# Patient Record
Sex: Female | Born: 1935 | Race: White | Hispanic: No | Smoking: Never smoker
Health system: Southern US, Community
[De-identification: ages and names within clinical notes are randomized; demographics above are authoritative.]

## PROBLEM LIST (undated history)

## (undated) DIAGNOSIS — I2510 Atherosclerotic heart disease of native coronary artery without angina pectoris: Secondary | ICD-10-CM

## (undated) DIAGNOSIS — F0390 Unspecified dementia without behavioral disturbance: Secondary | ICD-10-CM

## (undated) DIAGNOSIS — I10 Essential (primary) hypertension: Secondary | ICD-10-CM

## (undated) DIAGNOSIS — F329 Major depressive disorder, single episode, unspecified: Secondary | ICD-10-CM

## (undated) DIAGNOSIS — J449 Chronic obstructive pulmonary disease, unspecified: Secondary | ICD-10-CM

## (undated) DIAGNOSIS — I4891 Unspecified atrial fibrillation: Secondary | ICD-10-CM

## (undated) DIAGNOSIS — F32A Depression, unspecified: Secondary | ICD-10-CM

---

## 2004-04-10 ENCOUNTER — Encounter: Payer: Self-pay | Admitting: Family Medicine

## 2004-05-05 ENCOUNTER — Emergency Department: Payer: Self-pay | Admitting: Emergency Medicine

## 2004-05-05 ENCOUNTER — Other Ambulatory Visit: Payer: Self-pay

## 2004-05-09 ENCOUNTER — Encounter: Payer: Self-pay | Admitting: Family Medicine

## 2004-06-08 ENCOUNTER — Encounter: Payer: Self-pay | Admitting: Family Medicine

## 2005-11-29 ENCOUNTER — Ambulatory Visit: Payer: Self-pay | Admitting: Gastroenterology

## 2006-01-29 ENCOUNTER — Ambulatory Visit: Payer: Self-pay | Admitting: Cardiovascular Disease

## 2007-05-13 ENCOUNTER — Other Ambulatory Visit: Payer: Self-pay

## 2007-05-14 ENCOUNTER — Observation Stay: Payer: Self-pay | Admitting: Internal Medicine

## 2007-07-22 ENCOUNTER — Emergency Department: Payer: Self-pay | Admitting: Emergency Medicine

## 2007-07-22 ENCOUNTER — Other Ambulatory Visit: Payer: Self-pay

## 2007-11-23 ENCOUNTER — Other Ambulatory Visit: Payer: Self-pay

## 2007-11-23 ENCOUNTER — Emergency Department: Payer: Self-pay | Admitting: Emergency Medicine

## 2007-12-12 ENCOUNTER — Other Ambulatory Visit: Payer: Self-pay

## 2007-12-12 ENCOUNTER — Emergency Department: Payer: Self-pay | Admitting: Emergency Medicine

## 2008-04-07 ENCOUNTER — Emergency Department: Payer: Self-pay | Admitting: Emergency Medicine

## 2008-04-07 ENCOUNTER — Other Ambulatory Visit: Payer: Self-pay

## 2008-05-03 ENCOUNTER — Inpatient Hospital Stay: Payer: Self-pay | Admitting: Unknown Physician Specialty

## 2008-05-08 ENCOUNTER — Encounter: Payer: Self-pay | Admitting: Internal Medicine

## 2008-05-09 ENCOUNTER — Encounter: Payer: Self-pay | Admitting: Internal Medicine

## 2008-08-07 ENCOUNTER — Inpatient Hospital Stay: Payer: Self-pay | Admitting: Internal Medicine

## 2008-11-04 IMAGING — CR PELVIS - 1-2 VIEW
1 series · 1 of 1 positions shown · non-contrast
Comparison: No comparison

REASON FOR EXAM: injury from fall
COMMENTS:

PROCEDURE:     DXR - DXR PELVIS AP ONLY  - May 03, 2008 [DATE]
RESULT:     History: Fall

[view not recorded]
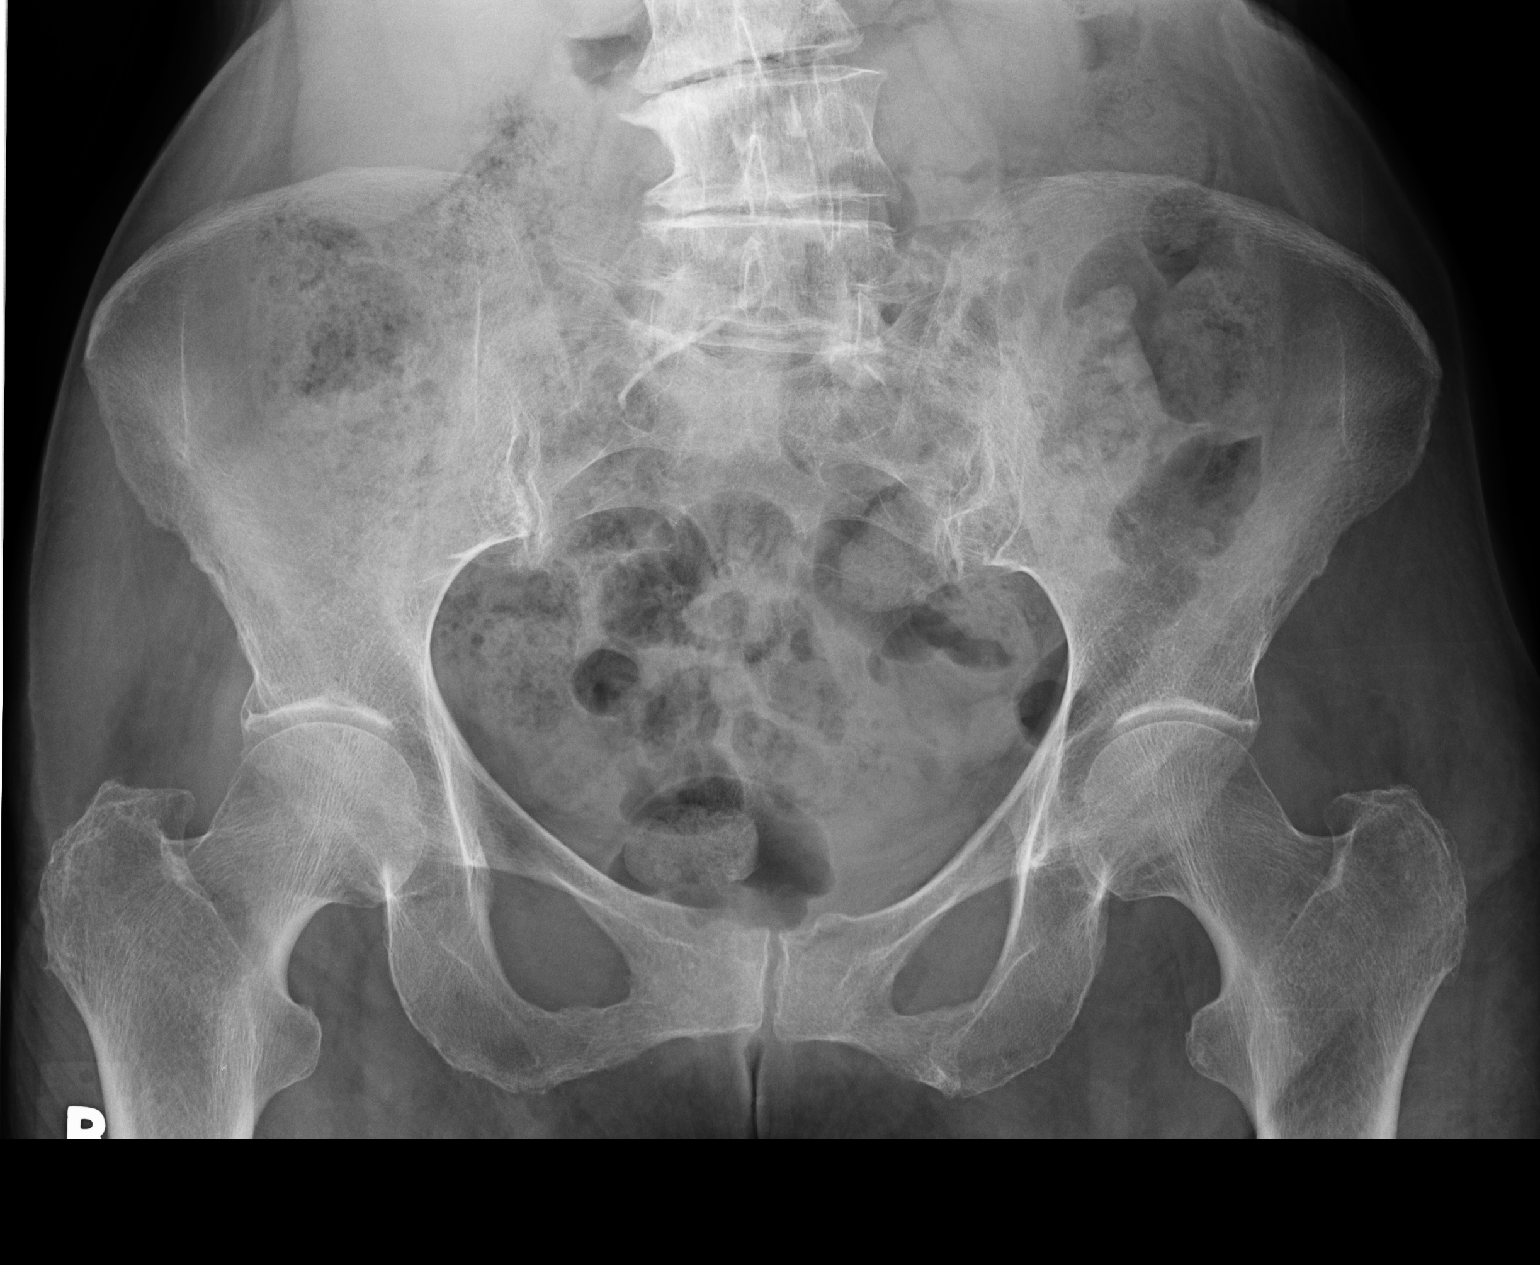

[1 of 1 positions shown; findings below may reference images not displayed]

FINDINGS: AP pelvis demonstrates no fracture or dislocation. The joint spaces are
relatively well maintained. There is generalized osteopenia. There
degenerative changes the lower lumbar spine and bilateral SI joints.
IMPRESSION: No acute osseous abnormality of the pelvis. Given the patient's age and
osteopenia, if there is further clinical concern for a hip fracture, a
screening MRI of the hip is recommended for increased sensitivity.

## 2008-11-04 IMAGING — CR DG FEMUR 2V*L*
1 series · 4 of 4 positions shown · non-contrast
Comparison: No comparison

REASON FOR EXAM: injury from fall
COMMENTS:

PROCEDURE:     DXR - DXR FEMUR LEFT  - May 03, 2008 [DATE]
RESULT:     History: Fall

[Series 1: view not recorded · 0.17mm/px · 4 of 4 slices shown]
[im 1/4]
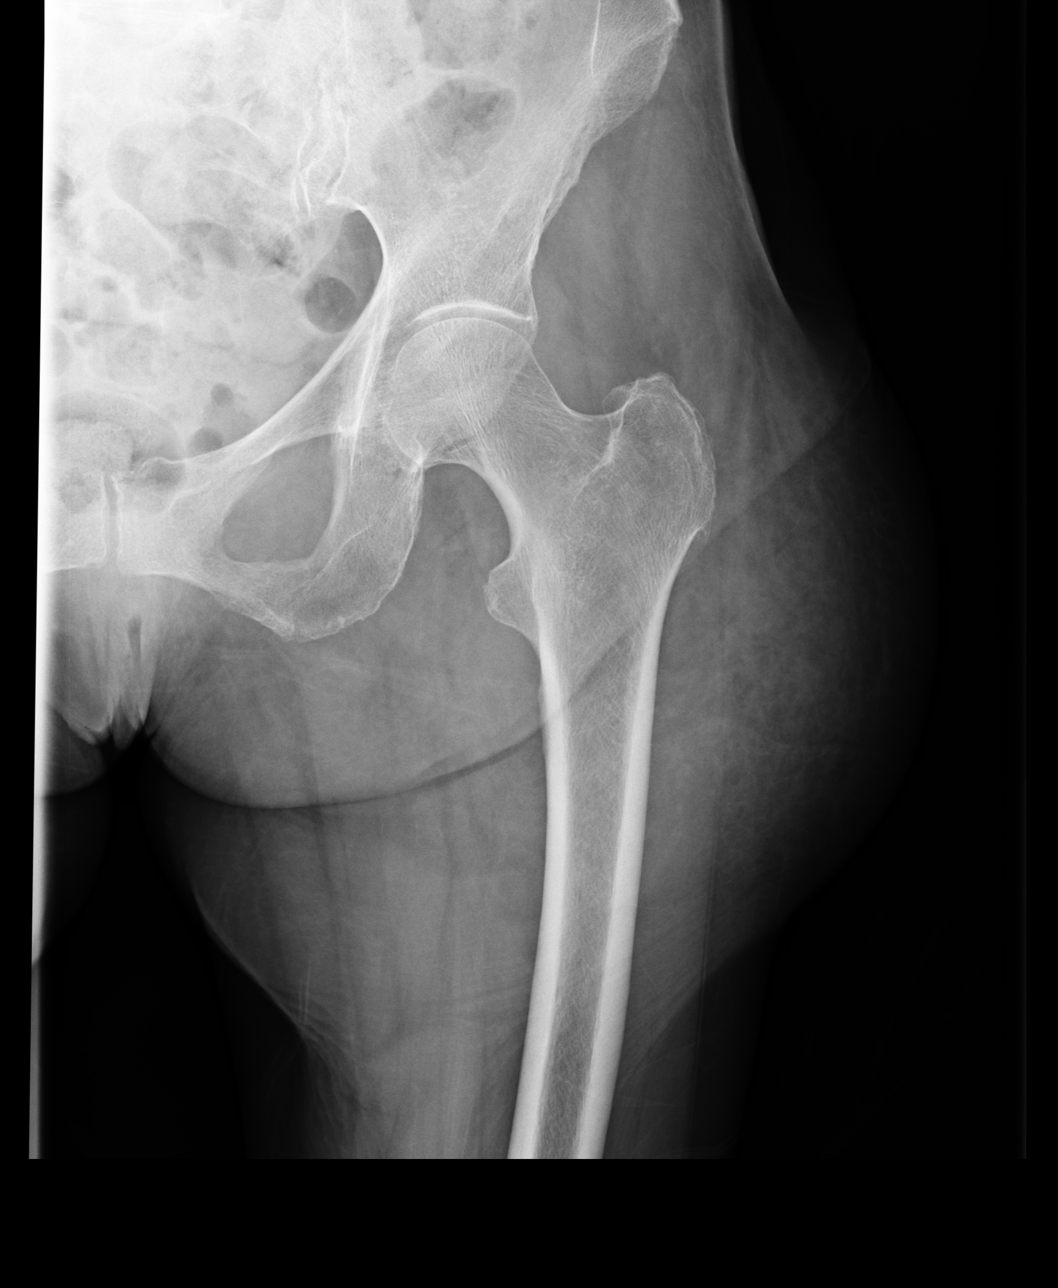
[im 2/4]
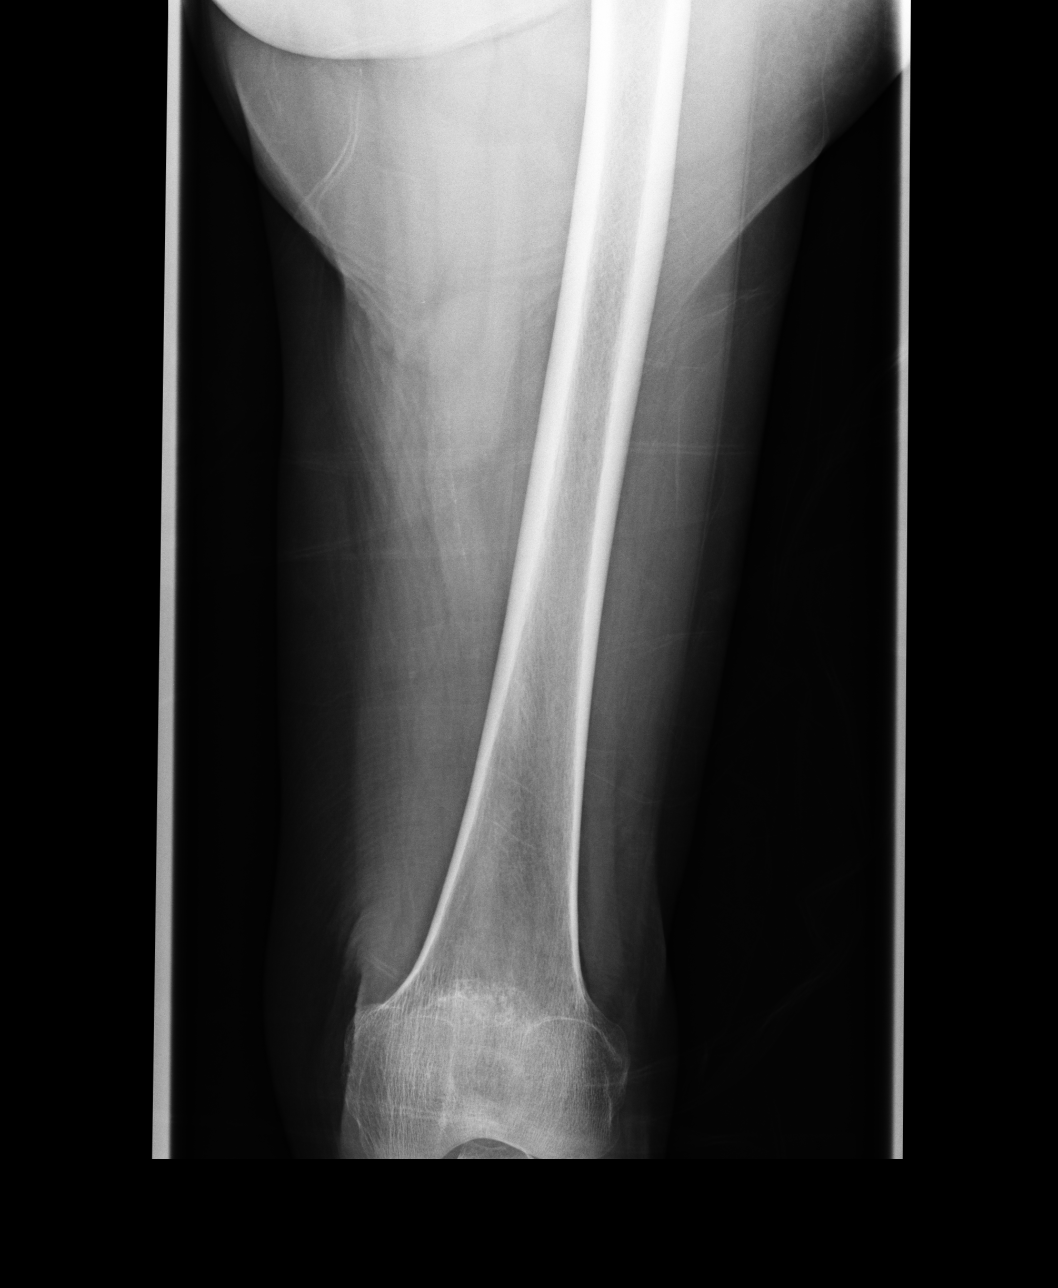
[im 3/4]
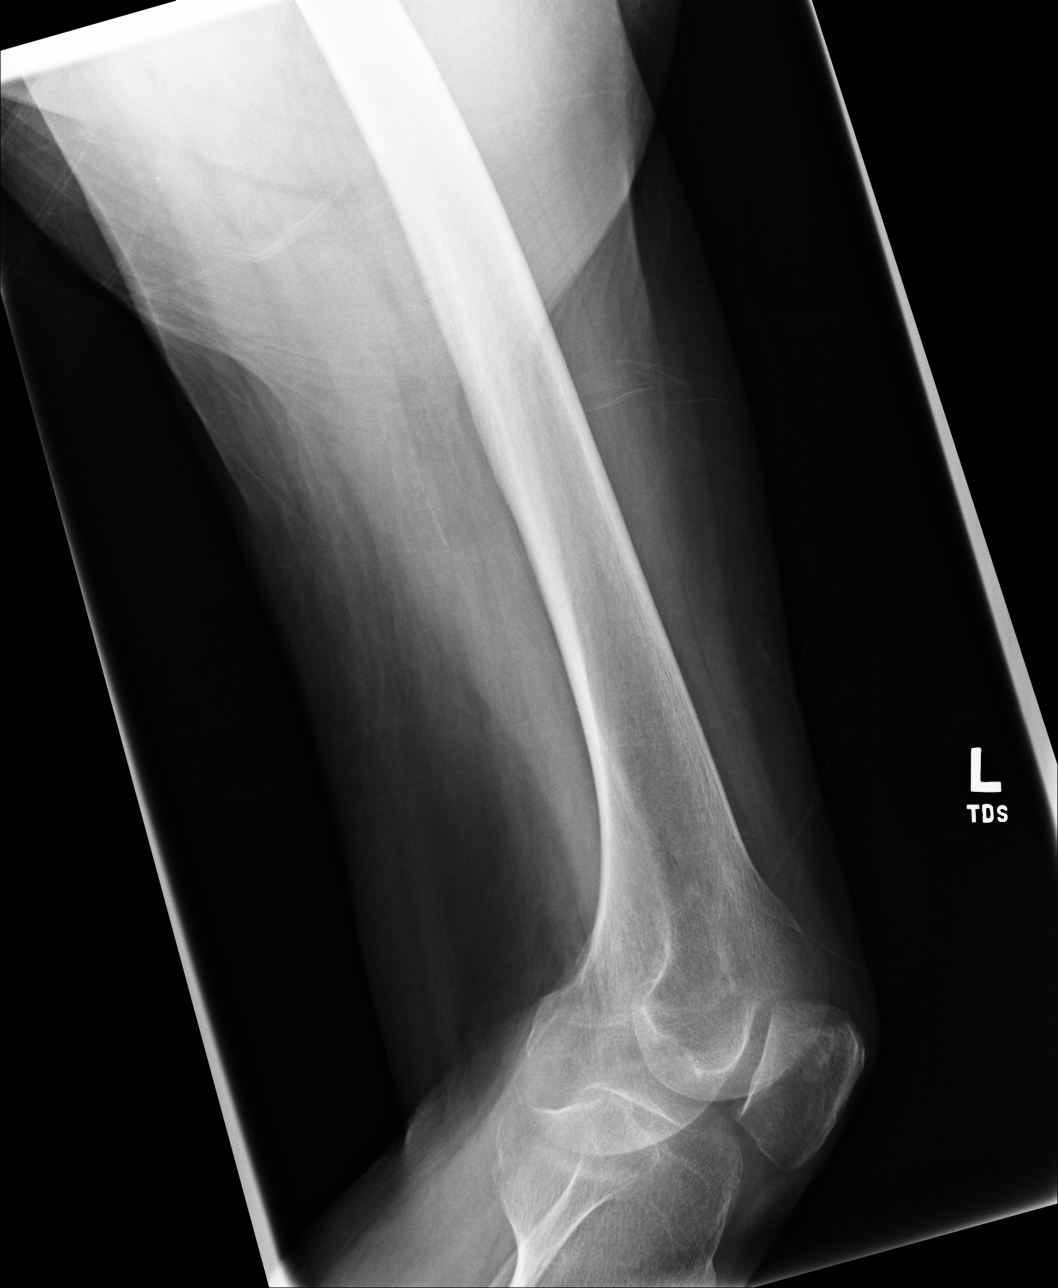
[im 4/4]
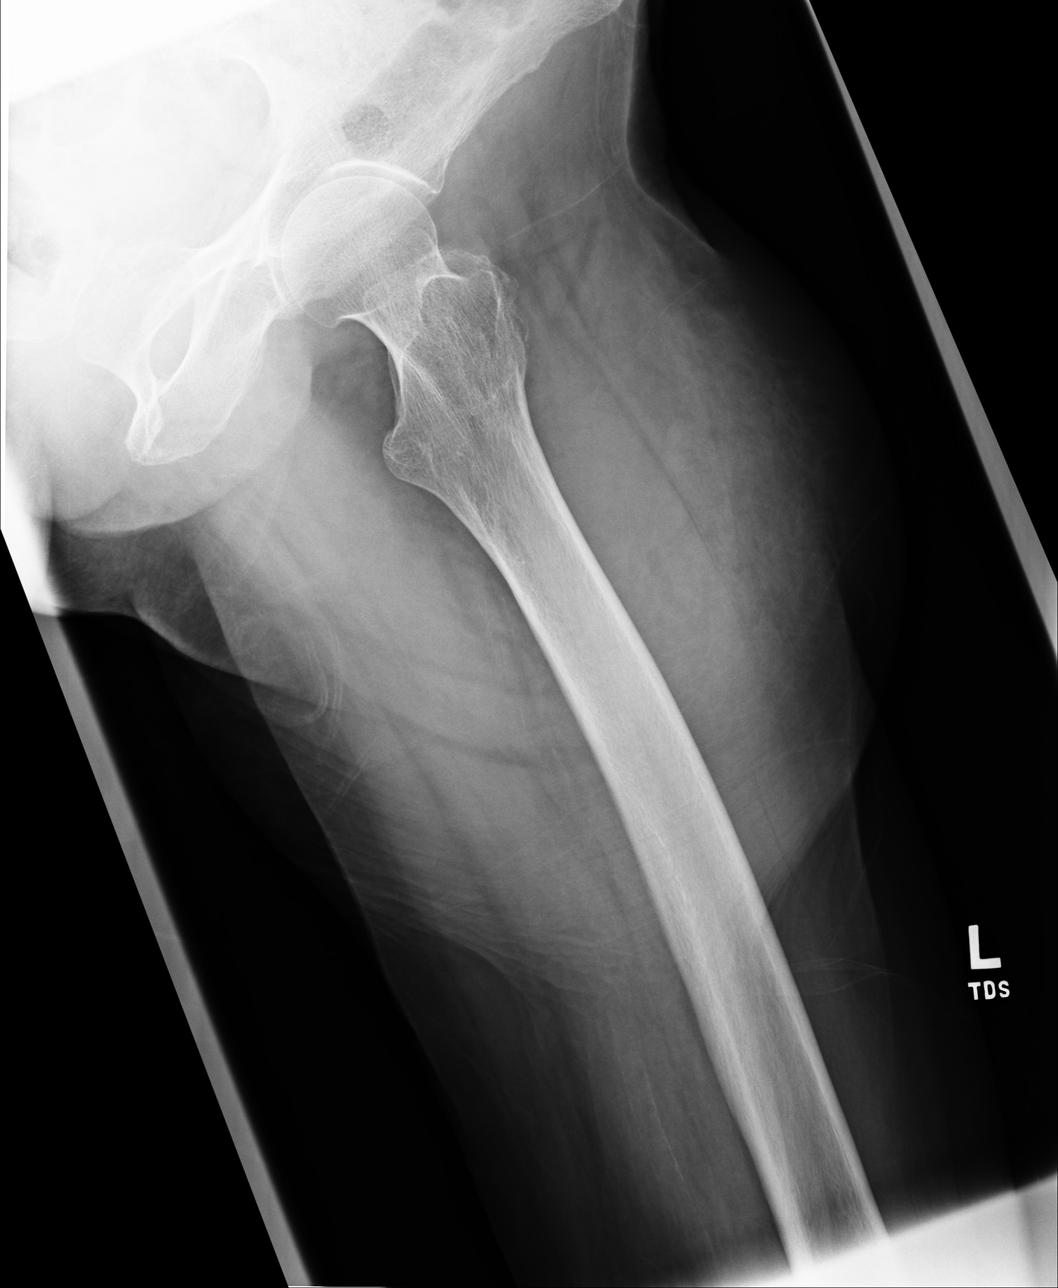

[4 of 4 positions shown; findings below may reference images not displayed]

FINDINGS: AP and lateral views of the left femur demonstrate no fracture or
dislocation. There is generalized osteopenia. The joint spaces are
relatively well maintained. The soft tissues are unremarkable.
IMPRESSION: No acute osseous injury of the left femur.

## 2008-11-10 ENCOUNTER — Ambulatory Visit: Payer: Self-pay | Admitting: Internal Medicine

## 2008-12-24 ENCOUNTER — Inpatient Hospital Stay: Payer: Self-pay | Admitting: Internal Medicine

## 2009-02-14 ENCOUNTER — Ambulatory Visit: Payer: Self-pay | Admitting: Gastroenterology

## 2009-08-31 ENCOUNTER — Ambulatory Visit: Payer: Self-pay | Admitting: Ophthalmology

## 2009-09-12 ENCOUNTER — Ambulatory Visit: Payer: Self-pay | Admitting: Ophthalmology

## 2010-02-06 ENCOUNTER — Ambulatory Visit: Payer: Self-pay | Admitting: Internal Medicine

## 2010-09-28 ENCOUNTER — Inpatient Hospital Stay: Payer: Self-pay

## 2011-09-03 ENCOUNTER — Inpatient Hospital Stay: Payer: Self-pay | Admitting: Internal Medicine

## 2011-09-03 LAB — CBC WITH DIFFERENTIAL/PLATELET
Basophil #: 0 10*3/uL (ref 0.0–0.1)
Basophil %: 0.5 %
Eosinophil %: 4.5 %
HGB: 11.9 g/dL — ABNORMAL LOW (ref 12.0–16.0)
Lymphocyte #: 0.9 10*3/uL — ABNORMAL LOW (ref 1.0–3.6)
MCH: 35.7 pg — ABNORMAL HIGH (ref 26.0–34.0)
MCHC: 32.6 g/dL (ref 32.0–36.0)
MCV: 110 fL — ABNORMAL HIGH (ref 80–100)
Monocyte #: 0.5 10*3/uL (ref 0.0–0.7)
Monocyte %: 8.6 %
RBC: 3.32 10*6/uL — ABNORMAL LOW (ref 3.80–5.20)

## 2011-09-03 LAB — COMPREHENSIVE METABOLIC PANEL
Albumin: 3.5 g/dL (ref 3.4–5.0)
Anion Gap: 4 — ABNORMAL LOW (ref 7–16)
Bilirubin,Total: 0.3 mg/dL (ref 0.2–1.0)
Calcium, Total: 8.6 mg/dL (ref 8.5–10.1)
Co2: 32 mmol/L (ref 21–32)
EGFR (African American): >60
Glucose: 84 mg/dL (ref 65–99)
Osmolality: 282 (ref 275–301)
Potassium: 3.7 mmol/L (ref 3.5–5.1)
Sodium: 142 mmol/L (ref 136–145)

## 2011-09-04 LAB — CBC WITH DIFFERENTIAL/PLATELET
Basophil %: 0.5 %
Eosinophil #: 0.3 10*3/uL (ref 0.0–0.7)
Eosinophil %: 7.3 %
HCT: 33.1 % — ABNORMAL LOW (ref 35.0–47.0)
HGB: 10.6 g/dL — ABNORMAL LOW (ref 12.0–16.0)
Lymphocyte #: 1.4 10*3/uL (ref 1.0–3.6)
MCH: 35.2 pg — ABNORMAL HIGH (ref 26.0–34.0)
MCHC: 32.1 g/dL (ref 32.0–36.0)
MCV: 110 fL — ABNORMAL HIGH (ref 80–100)
Monocyte #: 0.5 10*3/uL (ref 0.0–0.7)
Monocyte %: 10.2 %
Neutrophil #: 2.4 10*3/uL (ref 1.4–6.5)
Platelet: 131 10*3/uL — ABNORMAL LOW (ref 150–440)
RBC: 3.02 10*6/uL — ABNORMAL LOW (ref 3.80–5.20)
WBC: 4.6 10*3/uL (ref 3.6–11.0)

## 2011-09-04 LAB — PROTIME-INR: INR: 1

## 2011-09-05 LAB — BASIC METABOLIC PANEL
Anion Gap: 11 (ref 7–16)
BUN: 10 mg/dL (ref 7–18)
Calcium, Total: 8.6 mg/dL (ref 8.5–10.1)
Chloride: 107 mmol/L (ref 98–107)
EGFR (African American): >60
EGFR (Non-African Amer.): >60
Glucose: 89 mg/dL (ref 65–99)
Osmolality: 289 (ref 275–301)
Potassium: 3.8 mmol/L (ref 3.5–5.1)

## 2011-09-05 LAB — CBC WITH DIFFERENTIAL/PLATELET
Basophil #: 0 10*3/uL (ref 0.0–0.1)
Basophil %: 0.7 %
Eosinophil #: 0.4 10*3/uL (ref 0.0–0.7)
Eosinophil %: 8.2 %
Lymphocyte %: 29.1 %
MCH: 35.4 pg — ABNORMAL HIGH (ref 26.0–34.0)
MCHC: 32.4 g/dL (ref 32.0–36.0)
Monocyte #: 0.5 10*3/uL (ref 0.0–0.7)
Monocyte %: 11 %
Neutrophil %: 51 %
Platelet: 128 10*3/uL — ABNORMAL LOW (ref 150–440)
RBC: 3.13 10*6/uL — ABNORMAL LOW (ref 3.80–5.20)

## 2011-09-05 LAB — URINALYSIS, COMPLETE
Blood: NEGATIVE
Nitrite: NEGATIVE
Protein: NEGATIVE
RBC,UR: 1 /HPF (ref 0–5)
Specific Gravity: 1.005 (ref 1.003–1.030)

## 2011-09-07 LAB — COMPREHENSIVE METABOLIC PANEL
Albumin: 3 g/dL — ABNORMAL LOW (ref 3.4–5.0)
Alkaline Phosphatase: 68 U/L (ref 50–136)
BUN: 15 mg/dL (ref 7–18)
Calcium, Total: 8.4 mg/dL — ABNORMAL LOW (ref 8.5–10.1)
Chloride: 108 mmol/L — ABNORMAL HIGH (ref 98–107)
Glucose: 94 mg/dL (ref 65–99)
Osmolality: 287 (ref 275–301)
Sodium: 144 mmol/L (ref 136–145)
Total Protein: 5.8 g/dL — ABNORMAL LOW (ref 6.4–8.2)

## 2011-09-07 LAB — CBC WITH DIFFERENTIAL/PLATELET
Basophil %: 0.5 %
HCT: 32.8 % — ABNORMAL LOW (ref 35.0–47.0)
HGB: 10.8 g/dL — ABNORMAL LOW (ref 12.0–16.0)
Lymphocyte %: 24.6 %
MCH: 35.8 pg — ABNORMAL HIGH (ref 26.0–34.0)
MCHC: 32.9 g/dL (ref 32.0–36.0)
MCV: 109 fL — ABNORMAL HIGH (ref 80–100)
Neutrophil #: 2.9 10*3/uL (ref 1.4–6.5)
Neutrophil %: 54.4 %

## 2011-09-07 LAB — OCCULT BLOOD X 1 CARD TO LAB, STOOL: Occult Blood, Feces: NEGATIVE

## 2011-09-08 ENCOUNTER — Encounter: Payer: Self-pay | Admitting: Internal Medicine

## 2011-10-08 ENCOUNTER — Encounter: Payer: Self-pay | Admitting: Internal Medicine

## 2011-11-07 ENCOUNTER — Encounter: Payer: Self-pay | Admitting: Internal Medicine

## 2012-04-18 ENCOUNTER — Ambulatory Visit: Payer: Self-pay | Admitting: Internal Medicine

## 2012-06-02 ENCOUNTER — Emergency Department: Payer: Self-pay | Admitting: Emergency Medicine

## 2012-06-02 LAB — COMPREHENSIVE METABOLIC PANEL
Albumin: 3 g/dL — ABNORMAL LOW (ref 3.4–5.0)
Calcium, Total: 8.3 mg/dL — ABNORMAL LOW (ref 8.5–10.1)
Chloride: 105 mmol/L (ref 98–107)
Co2: 29 mmol/L (ref 21–32)
EGFR (African American): >60
EGFR (Non-African Amer.): 58 — ABNORMAL LOW
Potassium: 3.9 mmol/L (ref 3.5–5.1)
SGOT(AST): 18 U/L (ref 15–37)
SGPT (ALT): 13 U/L (ref 12–78)

## 2012-06-02 LAB — TROPONIN I: Troponin-I: <0.02 ng/mL

## 2012-06-02 LAB — CBC
HCT: 34.7 % — ABNORMAL LOW (ref 35.0–47.0)
HGB: 11.4 g/dL — ABNORMAL LOW (ref 12.0–16.0)
MCH: 30.1 pg (ref 26.0–34.0)
MCHC: 32.7 g/dL (ref 32.0–36.0)
MCV: 92 fL (ref 80–100)
RBC: 3.77 10*6/uL — ABNORMAL LOW (ref 3.80–5.20)
RDW: 13.5 % (ref 11.5–14.5)
WBC: 9 10*3/uL (ref 3.6–11.0)

## 2012-06-02 LAB — PRO B NATRIURETIC PEPTIDE: B-Type Natriuretic Peptide: 375 pg/mL (ref 0–450)

## 2012-06-02 LAB — CK TOTAL AND CKMB (NOT AT ARMC)
CK, Total: 40 U/L (ref 21–215)
CK-MB: 0.5 ng/mL (ref 0.5–3.6)

## 2012-06-05 LAB — COMPREHENSIVE METABOLIC PANEL
Albumin: 3.4 g/dL (ref 3.4–5.0)
Alkaline Phosphatase: 98 U/L (ref 50–136)
Bilirubin,Total: 0.4 mg/dL (ref 0.2–1.0)
Calcium, Total: 8.9 mg/dL (ref 8.5–10.1)
Chloride: 102 mmol/L (ref 98–107)
Creatinine: 0.97 mg/dL (ref 0.60–1.30)
EGFR (African American): 60 — ABNORMAL LOW
Potassium: 3.5 mmol/L (ref 3.5–5.1)
SGOT(AST): 28 U/L (ref 15–37)
SGPT (ALT): 20 U/L (ref 12–78)
Total Protein: 6.9 g/dL (ref 6.4–8.2)

## 2012-06-05 LAB — CBC WITH DIFFERENTIAL/PLATELET
Basophil #: 0 10*3/uL (ref 0.0–0.1)
Basophil %: 0.5 %
Eosinophil #: 0.1 10*3/uL (ref 0.0–0.7)
Eosinophil %: 1.4 %
HCT: 38.8 % (ref 35.0–47.0)
HGB: 12.9 g/dL (ref 12.0–16.0)
Lymphocyte #: 1.2 10*3/uL (ref 1.0–3.6)
MCH: 30.1 pg (ref 26.0–34.0)
MCHC: 33.2 g/dL (ref 32.0–36.0)
MCV: 91 fL (ref 80–100)
Monocyte #: 1 x10 3/mm — ABNORMAL HIGH (ref 0.2–0.9)
Neutrophil #: 6.1 10*3/uL (ref 1.4–6.5)
RDW: 13.3 % (ref 11.5–14.5)
WBC: 8.5 10*3/uL (ref 3.6–11.0)

## 2012-06-05 LAB — CK TOTAL AND CKMB (NOT AT ARMC): CK, Total: 48 U/L (ref 21–215)

## 2012-06-05 LAB — TROPONIN I: Troponin-I: <0.02 ng/mL

## 2012-06-06 ENCOUNTER — Inpatient Hospital Stay: Payer: Self-pay | Admitting: Internal Medicine

## 2012-06-06 LAB — CK TOTAL AND CKMB (NOT AT ARMC)
CK, Total: 42 U/L (ref 21–215)
CK-MB: 0.7 ng/mL (ref 0.5–3.6)

## 2012-06-06 LAB — BASIC METABOLIC PANEL
Anion Gap: 6 — ABNORMAL LOW (ref 7–16)
BUN: 8 mg/dL (ref 7–18)
Chloride: 109 mmol/L — ABNORMAL HIGH (ref 98–107)
Creatinine: 0.99 mg/dL (ref 0.60–1.30)
EGFR (Non-African Amer.): 50 — ABNORMAL LOW
Osmolality: 285 (ref 275–301)
Potassium: 4.1 mmol/L (ref 3.5–5.1)
Sodium: 144 mmol/L (ref 136–145)

## 2012-06-06 LAB — TROPONIN I: Troponin-I: <0.02 ng/mL

## 2012-06-06 LAB — CBC WITH DIFFERENTIAL/PLATELET
Eosinophil #: 0.1 10*3/uL (ref 0.0–0.7)
Eosinophil %: 2.4 %
HCT: 34.5 % — ABNORMAL LOW (ref 35.0–47.0)
Lymphocyte #: 1.4 10*3/uL (ref 1.0–3.6)
MCH: 30.5 pg (ref 26.0–34.0)
MCV: 92 fL (ref 80–100)
Monocyte %: 15.5 %
Neutrophil #: 3.6 10*3/uL (ref 1.4–6.5)
Neutrophil %: 57.9 %
RBC: 3.77 10*6/uL — ABNORMAL LOW (ref 3.80–5.20)
RDW: 13.3 % (ref 11.5–14.5)
WBC: 6.1 10*3/uL (ref 3.6–11.0)

## 2012-06-07 LAB — CBC WITH DIFFERENTIAL/PLATELET
Basophil %: 0.7 %
Eosinophil #: 0.4 10*3/uL (ref 0.0–0.7)
Eosinophil %: 6.4 %
HCT: 33.7 % — ABNORMAL LOW (ref 35.0–47.0)
HGB: 11.2 g/dL — ABNORMAL LOW (ref 12.0–16.0)
Lymphocyte #: 1.6 10*3/uL (ref 1.0–3.6)
Lymphocyte %: 28.5 %
MCH: 30.2 pg (ref 26.0–34.0)
MCHC: 33.2 g/dL (ref 32.0–36.0)
Monocyte #: 0.8 x10 3/mm (ref 0.2–0.9)
Neutrophil %: 50.3 %
Platelet: 177 10*3/uL (ref 150–440)
RBC: 3.71 10*6/uL — ABNORMAL LOW (ref 3.80–5.20)
WBC: 5.7 10*3/uL (ref 3.6–11.0)

## 2012-06-07 LAB — BASIC METABOLIC PANEL
Anion Gap: 6 — ABNORMAL LOW (ref 7–16)
BUN: 12 mg/dL (ref 7–18)
Creatinine: 0.94 mg/dL (ref 0.60–1.30)
EGFR (Non-African Amer.): 53 — ABNORMAL LOW
Glucose: 83 mg/dL (ref 65–99)
Osmolality: 278 (ref 275–301)

## 2012-06-09 LAB — CK TOTAL AND CKMB (NOT AT ARMC)
CK, Total: 31 U/L (ref 21–215)
CK-MB: 1.6 ng/mL (ref 0.5–3.6)
CK-MB: 1.5 ng/mL (ref 0.5–3.6)

## 2012-06-09 LAB — TROPONIN I
Troponin-I: 0.04 ng/mL
Troponin-I: 0.04 ng/mL

## 2012-06-10 LAB — CK TOTAL AND CKMB (NOT AT ARMC): CK, Total: 27 U/L (ref 21–215)

## 2012-06-11 LAB — CULTURE, BLOOD (SINGLE)

## 2012-08-14 LAB — DIGOXIN LEVEL: Digoxin: 1.09 ng/mL

## 2012-08-14 LAB — URINALYSIS, COMPLETE
Bacteria: NONE SEEN
Blood: NEGATIVE
Glucose,UR: NEGATIVE mg/dL (ref 0–75)
Ketone: NEGATIVE
Nitrite: NEGATIVE
Ph: 8 (ref 4.5–8.0)
Protein: NEGATIVE
RBC,UR: 2 /HPF (ref 0–5)
Specific Gravity: 1.01 (ref 1.003–1.030)
Squamous Epithelial: <1

## 2012-08-14 LAB — COMPREHENSIVE METABOLIC PANEL
Albumin: 3.4 g/dL (ref 3.4–5.0)
Alkaline Phosphatase: 113 U/L (ref 50–136)
Anion Gap: 7 (ref 7–16)
BUN: 14 mg/dL (ref 7–18)
Bilirubin,Total: 0.4 mg/dL (ref 0.2–1.0)
Chloride: 105 mmol/L (ref 98–107)
Creatinine: 0.86 mg/dL (ref 0.60–1.30)
EGFR (Non-African Amer.): 59 — ABNORMAL LOW
Potassium: 4 mmol/L (ref 3.5–5.1)
Sodium: 139 mmol/L (ref 136–145)

## 2012-08-14 LAB — TROPONIN I: Troponin-I: <0.02 ng/mL

## 2012-08-14 LAB — CBC
MCH: 28.6 pg (ref 26.0–34.0)
MCHC: 32 g/dL (ref 32.0–36.0)
MCV: 89 fL (ref 80–100)
Platelet: 140 10*3/uL — ABNORMAL LOW (ref 150–440)
RBC: 4.41 10*6/uL (ref 3.80–5.20)
RDW: 14 % (ref 11.5–14.5)

## 2012-08-14 LAB — CK TOTAL AND CKMB (NOT AT ARMC): CK-MB: 0.5 ng/mL (ref 0.5–3.6)

## 2012-08-15 ENCOUNTER — Observation Stay: Payer: Self-pay | Admitting: Internal Medicine

## 2012-12-04 IMAGING — CR DG CHEST 2V
1 series · 2 of 2 positions shown · non-contrast
Comparison: none

REASON FOR EXAM: cough, sob
COMMENTS:

PROCEDURE:     DXR - DXR CHEST PA (OR AP) AND LATERAL  - June 02, 2012  [DATE]
RESULT:     Comparison: 09/28/2010

[Series 1: w chest pa · 0.14mm/px · 2 of 2 slices shown]
[im 1/2]
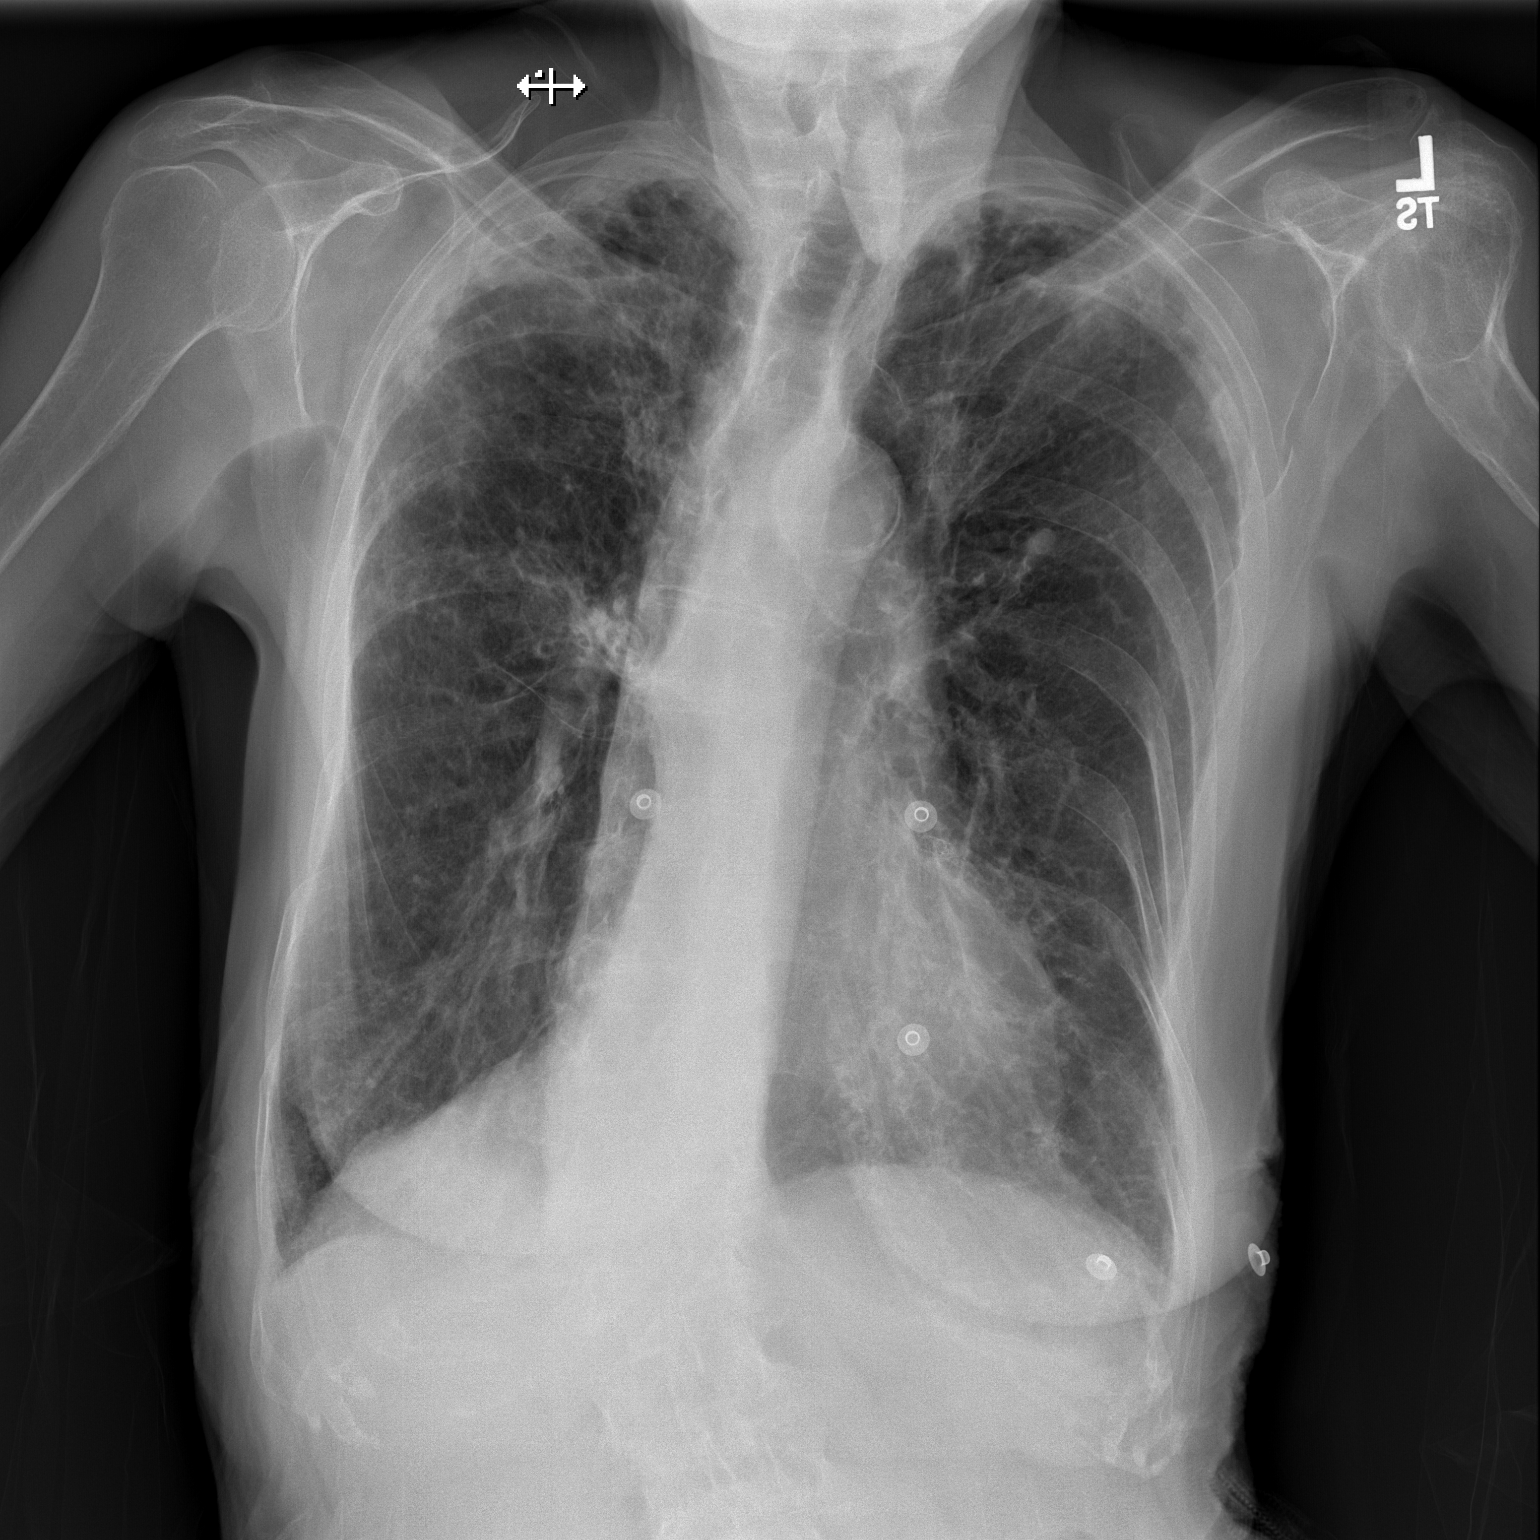
[im 2/2]
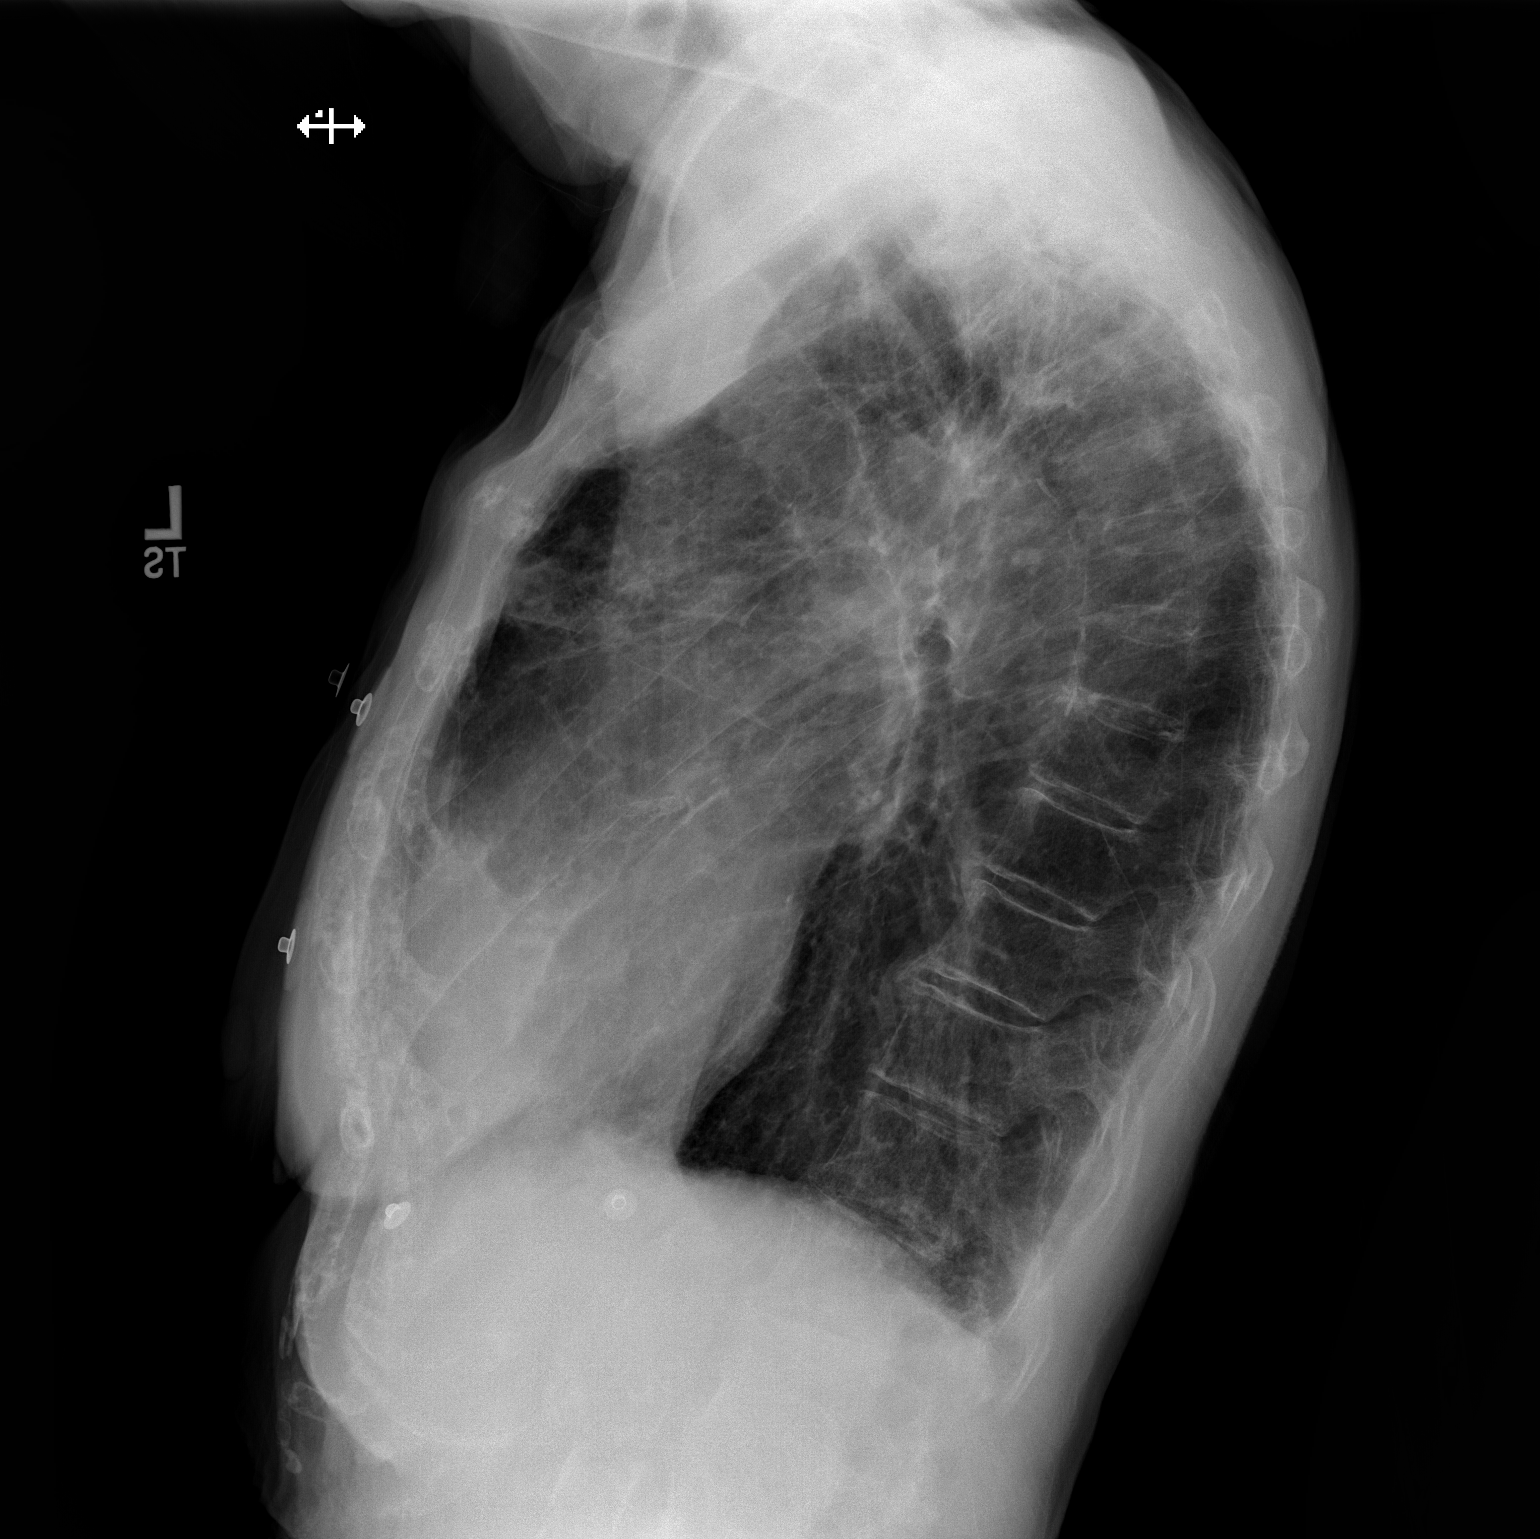

[2 of 2 positions shown; findings below may reference images not displayed]

FINDINGS: PA and lateral chest radiographs are provided. There is chronic biapical
scarring. The lungs are hyperinflated likely secondary to COPD. There is no
focal parenchymal opacity, pleural effusion, or pneumothorax. The heart and
mediastinum are unremarkable.  The osseous structures are unremarkable.
IMPRESSION: No acute disease of the che[REDACTED]

## 2013-03-12 ENCOUNTER — Emergency Department: Payer: Self-pay | Admitting: Emergency Medicine

## 2013-03-12 LAB — COMPREHENSIVE METABOLIC PANEL
Albumin: 3.2 g/dL — ABNORMAL LOW (ref 3.4–5.0)
Alkaline Phosphatase: 119 U/L (ref 50–136)
Anion Gap: 1 — ABNORMAL LOW (ref 7–16)
Calcium, Total: 8.4 mg/dL — ABNORMAL LOW (ref 8.5–10.1)
Chloride: 105 mmol/L (ref 98–107)
Co2: 31 mmol/L (ref 21–32)
Creatinine: 1.05 mg/dL (ref 0.60–1.30)
EGFR (African American): 54 — ABNORMAL LOW
EGFR (Non-African Amer.): 46 — ABNORMAL LOW
Glucose: 85 mg/dL (ref 65–99)
Osmolality: 273 (ref 275–301)
Potassium: 4.3 mmol/L (ref 3.5–5.1)
SGOT(AST): 20 U/L (ref 15–37)
SGPT (ALT): 13 U/L (ref 12–78)

## 2013-03-12 LAB — URINALYSIS, COMPLETE
Bacteria: NONE SEEN
Blood: NEGATIVE
Glucose,UR: NEGATIVE mg/dL (ref 0–75)
Leukocyte Esterase: NEGATIVE
Nitrite: NEGATIVE
RBC,UR: <1 /HPF (ref 0–5)
Specific Gravity: 1.013 (ref 1.003–1.030)
Squamous Epithelial: 1
WBC UR: <1 /HPF (ref 0–5)

## 2013-03-12 LAB — LIPASE, BLOOD: Lipase: 124 U/L (ref 73–393)

## 2013-03-12 LAB — CK TOTAL AND CKMB (NOT AT ARMC): CK-MB: 0.7 ng/mL (ref 0.5–3.6)

## 2013-03-12 LAB — APTT: Activated PTT: 33.4 secs (ref 23.6–35.9)

## 2013-03-12 LAB — CBC
HCT: 39.2 % (ref 35.0–47.0)
HGB: 13 g/dL (ref 12.0–16.0)
MCHC: 33.2 g/dL (ref 32.0–36.0)
RBC: 4.5 10*6/uL (ref 3.80–5.20)
RDW: 14.1 % (ref 11.5–14.5)
WBC: 7.1 10*3/uL (ref 3.6–11.0)

## 2013-03-12 LAB — PROTIME-INR: Prothrombin Time: 14.1 secs (ref 11.5–14.7)

## 2014-07-01 ENCOUNTER — Inpatient Hospital Stay: Payer: Self-pay | Admitting: Family Medicine

## 2014-07-01 LAB — COMPREHENSIVE METABOLIC PANEL
ANION GAP: 7 (ref 7–16)
Albumin: 3.4 g/dL (ref 3.4–5.0)
Alkaline Phosphatase: 120 U/L — ABNORMAL HIGH
BUN: 12 mg/dL (ref 7–18)
Bilirubin,Total: 0.3 mg/dL (ref 0.2–1.0)
CO2: 29 mmol/L (ref 21–32)
Calcium, Total: 8.6 mg/dL (ref 8.5–10.1)
Chloride: 102 mmol/L (ref 98–107)
Creatinine: 0.91 mg/dL (ref 0.60–1.30)
EGFR (Non-African Amer.): >60
GLUCOSE: 96 mg/dL (ref 65–99)
Osmolality: 275 (ref 275–301)
Potassium: 4.5 mmol/L (ref 3.5–5.1)
SGOT(AST): 25 U/L (ref 15–37)
SGPT (ALT): 14 U/L
Sodium: 138 mmol/L (ref 136–145)
Total Protein: 7 g/dL (ref 6.4–8.2)

## 2014-07-01 LAB — CBC
HCT: 41.8 % (ref 35.0–47.0)
HGB: 13.6 g/dL (ref 12.0–16.0)
MCH: 29.1 pg (ref 26.0–34.0)
MCHC: 32.5 g/dL (ref 32.0–36.0)
MCV: 90 fL (ref 80–100)
Platelet: 150 10*3/uL (ref 150–440)
RBC: 4.67 10*6/uL (ref 3.80–5.20)
RDW: 14.7 % — ABNORMAL HIGH (ref 11.5–14.5)
WBC: 6.8 10*3/uL (ref 3.6–11.0)

## 2014-07-01 LAB — APTT: Activated PTT: 31.9 secs (ref 23.6–35.9)

## 2014-07-01 LAB — TROPONIN I
TROPONIN-I: 0.19 ng/mL — AB
Troponin-I: 0.03 ng/mL
Troponin-I: 0.14 ng/mL — ABNORMAL HIGH

## 2014-07-01 LAB — PROTIME-INR
INR: 1
PROTHROMBIN TIME: 13 s (ref 11.5–14.7)

## 2014-10-26 NOTE — Discharge Summary (Signed)
PATIENT NAME:  Karen Bradshaw, Karen Bradshaw MR#:  [id number] DATE OF BIRTH:  10-18-1919  DATE OF ADMISSION:  06/06/2012 DATE OF DISCHARGE:  06/11/2012   DISCHARGE DIAGNOSES:  1. Severe bronchitis with hypoxia and bronchiectasis.  2. Rapid atrial fibrillation with hypotension.  3. Coronary artery disease.  4. Hyperlipidemia.   DISCHARGE MEDICATIONS:  1. Toprol-XL 25 mg b.i.d.  2. Plavix 75 mg daily.  3. Aspirin 81 mg daily.  4. Digoxin 125 mcg daily.  5. HCTZ 25 mg daily p.Bradshaw.n. edema. 6. Zoloft 50 mg daily. 7. Protonix 40 mg daily. 8. Tylenol 650 mg q.6 p.Bradshaw.n. pain/fever.  9. Levaquin 250 mg daily x5 days.   REASON FOR ADMISSION: The patient is a 81 year old female who presented with cough and congestion with hypoxia. Please see history and physical for history of present illness, past medical history, and physical exam.   HOSPITAL COURSE: The patient was admitted and treated aggressively with steroids, antibiotics, and SVNs. Her course improved fairly readily. Her oxygenation improved. Her cough resolved, however, then she went into rapid atrial fibrillation with hypotension and was transferred to the unit on IV Cardizem. With p.o. metoprolol she flipped back into normal sinus rhythm as her steroids and SVNs were discontinued. She has been back in sinus rhythm, asymptomatic. Her lungs were clear this morning. Oxygenation is 95% on room air. She is back at baseline for transfer to assisted living.      OVERALL PROGNOSIS: Guarded.   ____________________________ Danella PentonMark F. Miller, MD mfm:drc D: 06/11/2012 08:13:04 ET T: 06/11/2012 08:21:41 ET JOB#: [id number]  cc: Danella PentonMark F. Miller, MD, <Dictator> MARK Sherlene ShamsF MILLER MD ELECTRONICALLY SIGNED 06/11/2012 13:44

## 2014-10-26 NOTE — H&P (Signed)
PATIENT NAME:  Karen Bradshaw, Karen Bradshaw MR#:  [id number] DATE OF BIRTH:  1920/01/24  DATE OF ADMISSION:  06/05/2012  ADMITTING PHYSICIAN: Enid Baasadhika Auron Tadros, MD   PRIMARY MD: Bethann PunchesMark Miller, MD   CHIEF COMPLAINT: Chest pain and difficulty breathing.   HISTORY OF PRESENT ILLNESS: Ms. Karen Bradshaw is a 81 year old elderly Caucasian female with past medical history significant for coronary artery disease status post history of stent placement, chronic bronchiectasis and asthma, hyperlipidemia, and chronic atrial fibrillation who comes to the hospital secondary to the above-mentioned complaints. The patient was here in the ER three days ago for similar complaints and was sent home on azithromycin. She has not felt any better but waited for a couple of days. Her cough is getting worse and now she was coughing up chunks of brownish stuff too. Denies any fevers but woke up with chest pressure and also difficulty breathing this morning and could not take it anymore so she came to the Emergency Room. She was sating fine at 97% on room air but chest x-ray shows worsening and developing infiltrate on the right lower lobe. She is being admitted for possible pneumonia failing outpatient antibiotic treatment.   PAST MEDICAL HISTORY:  1. Asthma.  2. Bronchiectasis.  3. History of atrial fibrillation.  4. Hyperlipidemia.  5. Coronary artery disease, status post history of stent placement.   PAST SURGICAL HISTORY:  1. Hysterectomy.  2. Tonsillectomy.  3. Cardiac stent placement.   ALLERGIES TO MEDICATIONS: No known drug allergies.   CURRENT MEDICATIONS: The patient is not sure what medications she takes at home as she did not bring her list. From her old discharge from March 2013 she is on the following medications: 1. Digoxin 0.125 mg p.o. daily.  2. Toprol-XL 25 mg p.o. b.i.d.  3. Protonix 40 mg p.o. daily.  4. According to the son, she also takes baby aspirin 81 mg p.o. daily.  5. Plavix 75 mg p.o. daily.    SOCIAL HISTORY[place] She has four sons who check on her frequently. She is widowed. Denies any smoking or alcohol use. She still continues to drive and is steady on her feet and walks using cane occasionally.   FAMILY HISTORY: She was not sure what her parents died from. Her mom lived up to 3770's and father died young. He was a farmer and suffered from some febrile illness.   REVIEW OF SYSTEMS: CONSTITUTIONAL: No fever, fatigue, or weakness. EYES: No blurred vision, double vision, glaucoma, or cataracts. Uses reading glasses. ENT: No tinnitus, ear pain, epistaxis. Positive for postnasal drip and runny nose that started about a week ago. RESPIRATORY: Positive for cough. No wheezing. No hemoptysis. Positive for dyspnea on exertion. CARDIOVASCULAR: Positive for chest pain. No orthopnea, edema, arrhythmia, palpitations, or syncope. GI: No nausea, vomiting, diarrhea, abdominal pain, hematemesis, or melena. GU: No dysuria, hematuria, renal calculus, frequency, or incontinence. ENDOCRINE: No polyuria, nocturia, thyroid problems, heat or cold intolerance. HEMATOLOGY: No anemia, easy bruising or bleeding. SKIN: No acne, rash, or lesions. MUSCULOSKELETAL: No neck, back, shoulder pain, arthritis, or gout. NEUROLOGIC: No numbness, weakness, CVA, TIA, or seizures. PSYCHOLOGICAL: No anxiety, insomnia, or depression.   PHYSICAL EXAMINATION:   VITAL SIGNS: Temperature 97.6 degrees Fahrenheit, pulse 65, respirations 18, blood pressure 127/55, pulse oximetry 97% on room air.   GENERAL: Well built, well nourished elderly female sitting in bed not in any acute distress.   HEENT: Normocephalic, atraumatic. Pupils equal, round, reacting to light. Anicteric sclerae. Extraocular movements  intact. Oropharynx clear without erythema, mass, or exudates.   NECK: Supple. No thyromegaly, JVD, or carotid bruits. No lymphadenopathy.   LUNGS: Moving air bilaterally. Coarse rhonchi heard  bibasilar on both sides. No use of accessory muscles for breathing. No wheeze or crackles.   CARDIOVASCULAR: S1, S2 regular rate and rhythm. No murmurs, rubs, or gallops.   ABDOMEN: Soft, nontender, nondistended. No hepatosplenomegaly. Normal bowel sounds.   EXTREMITIES: No pedal edema. No clubbing or cyanosis. 2+ dorsalis pedis pulses palpable bilaterally.   SKIN: No acne, rash, or lesions.   LYMPH: No cervical or inguinal lymphadenopathy.   NEUROLOGIC: Cranial nerves intact. No focal motor or sensory deficits.   PSYCHOLOGICAL: The patient is awake, alert, oriented x3.   LABORATORY DATA: WBC 8.5, hemoglobin 12.9, hematocrit 38.8, platelet count 176, sodium 138, potassium 3.5, chloride 102, bicarb 31, BUN 11, creatinine 0.97, glucose 104, calcium 8.9, ALT 20, AST 28, alkaline phosphatase 98, total bilirubin 0.4, albumin 3.4. Troponin, first set, is negative. Digoxin level is 0.10 within normal limits.    Chest x-ray showing chronic biapical scarring, hyperinflated lungs secondary to COPD. No pleural effusion or pneumothorax. Heart and mediastinum is unremarkable. Early developing infiltrate on the right lower side.    EKG showing normal sinus rhythm, heart rate 67. No ST-T wave abnormalities.   ASSESSMENT AND PLAN: This is a 81 year old female with history of asthma, bronchiectasis, coronary artery disease, and hyperlipidemia who was here in the ED three days ago for dyspnea and worsening cough but sent home on Z-Pak and comes back with worsening chest pain and also cough and dyspnea. Chest x-ray showing early right lower lobe pneumonia.  1. Early right lower lobe pneumonia and bronchiectasis, failed outpatient Z-Pak with worsening symptoms. Blood cultures were drawn here in the ED. Will start on Levaquin. The patient is afebrile and no elevated white count. Will admit under observation. 2. Chest pain likely from above. Due to her cardiac history, will monitor her on telemetry and check  cardiac enzymes. Her EKG does not show any acute ST-T wave abnormalities.  3. CAD status post stent. On Plavix, aspirin, digoxin, and Toprol. Home medications need to be verified.  4. Chronic atrial fibrillation. On Toprol. [place] score is low. Continue aspirin and Plavix. 5. Gastroesophageal reflux disease. Continue Protonix.  6. DVT prophylaxis. On aspirin and Plavix and TED stockings. 7. GI prophylaxis. On Protonix.   CODE STATUS: FULL CODE.   TIME SPENT ON ADMISSION: 50 minutes.   ____________________________ Enid Baas, MD rk:drc D: 06/05/2012 13:40:43 ET T: 06/05/2012 14:34:32 ET JOB#: [id number]  cc: Enid Baas, MD, <Dictator> Danella Penton, MD Enid Baas MD ELECTRONICALLY SIGNED 06/05/2012 18:00

## 2014-10-29 NOTE — H&P (Signed)
PATIENT NAME:  Karen Bradshaw, Karen Bradshaw MR#:  [id number] DATE OF BIRTH:  Jan 28, 1920  DATE OF ADMISSION:  08/15/2012  PRIMARY CARE PHYSICIAN:  Dr. Bethann Punches.   REFERRING PHYSICIAN:  Dr. Si Raider.   CHIEF COMPLAINT:  Dizziness.   HISTORY OF PRESENT ILLNESS:  The patient is a 81 year old pleasant Caucasian female.  She is a resident of [facility], was in her usual state of health until today when patient experienced dizziness described as lightheadedness even with sitting from lying position.  She denies any vertigo.  Denies any spinning.  No blurring of vision.  No double vision.  No focal weakness.  No dysarthria.  No difficulty in swallowing.  Symptoms had persisted and finally she was transferred to the Emergency Department for evaluation.  Here, all her blood work-up was unremarkable.  It was noted that she is orthostatic by blood pressure evaluation from supine to sitting to standing.  Also noted to have bradycardia with heart rate dropping in the upper 40s.  The patient received intravenous fluid and admitted for 24-hour observation.  At this time the patient denies having any other discomfort.  No chest pain.  No shortness of breath.  No palpitations.  No fever.  No diarrhea.  No vomiting.   REVIEW OF SYSTEMS:  CONSTITUTIONAL:  Denies any fever.  No chills.  No fatigue.  EYES:  No blurring of vision.  No double vision.  EARS, NOSE, THROAT:  No hearing impairment.  No sore throat.  No dysphagia, but she describes the dizziness as above.  CARDIOVASCULAR:  No chest pain.  No shortness of breath.  No edema.  No syncope.  RESPIRATORY:  No shortness of breath.  No cough.  No hemoptysis.  She describes some postnasal drip.  This is not something new.  GASTROINTESTINAL:  No abdominal pain, no vomiting, no diarrhea.  GENITOURINARY:  No dysuria.  No frequency of urination.  MUSCULOSKELETAL:  No joint pain or swelling, but describes some aching pains in her shoulders.  No muscular  pain or swelling.  INTEGUMENTARY:  No skin rash.  No ulcers.  NEUROLOGY:  No focal weakness.  No seizure activity.  No headache.  PSYCHIATRY:  No anxiety.  No depression.  ENDOCRINE:  No polyuria or polydipsia.  No heat or cold intolerance.  HEMATOLOGY:  No easy bruisability.  No lymph node enlargement.   PAST MEDICAL HISTORY:  Paroxysmal atrial fibrillation, asthma, bronchiectasis and chronic lung changes, hyperlipidemia, coronary artery disease status post stent placement, diverticulosis.   PAST SURGICAL HISTORY:  Cardiac stent placement, hysterectomy and tonsillectomy.   FAMILY HISTORY:  Her father died young, he was a farmer and suffered from febrile illness.  Her mother lived up to the 59s.   SOCIAL HISTORY:  She is widowed.  Used to live at home, but the last few months she moved to [facility].   SOCIAL HABITS:  Nonsmoker, but exposed to secondhand smoking from her husband.  No history of alcohol or drug abuse.   ADMISSION MEDICATIONS:  Trazodone 50 mg at night, nitroglycerin 0.4 mg q. 5 minutes as needed for chest pain, metoprolol succinate 25 mg twice a day, hydrochlorothiazide 25 mg once a day and pantoprazole or Protonix 40 mg once a day, aspirin 81 mg a day, Plavix 75 mg a day, digoxin 0.125 mg once a day and Sertraline 50 mg once a day, vitamin B12 500 mcg once a day.   ALLERGIES:  No known drug allergies.  I  would like to clarify a discrepancy in the records about her allergies.  Most of the records including the nursing home records, that she has no known drug allergies and she confirmed that, however there are records saying that she is allergic to SULFA, ERYTHROMYCIN AND PENICILLIN.  The patient denies that, states that she tried to avoid SULFA because of nausea, but she is not allergic to any other medication.    PHYSICAL EXAMINATION: VITAL SIGNS:  Her blood pressure on the right arm in lying or supine position was 208/98, in sitting position was 195/84, in  standing position was 177/83.  Her repeat orthostatic changes showed a supine blood pressure of 184/75, in sitting position was 188/54, in standing was 167/81.  There is no significant heart rate changes from 48 in supine position up to 61 in upright position.  Respiratory rate 16, pulse rate was ranging between 48 to 51, temperature 97, oxygen saturation 96%. GENERAL APPEARANCE:  Elderly female lying in bed in no acute distress, healthy-looking.  EARS:  Examination revealed normal hearing, no discharge, no lesions.   NOSE:  Nasal mucosa was normal, no discharge, no bleeding.  No ulcers.  MOUTH:  Oropharyngeal area was normal without ulcers, no oral thrush.  EYES:  Examination revealed normal eyelids and normal conjunctivae.  Pupils about 4 to 5 mm, equal, round and reactive to light.  NECK:  Supple.  Trachea at midline.  No thyromegaly.  No cervical lymphadenopathy.  No masses.  HEART:  Normal S1, S2.  No S3, S4.  No murmur.  No gallop.  No carotid bruits.  LUNGS:  Normal breathing pattern without use of accessory muscles.  No rales.  No wheezing.  ABDOMEN:  Soft without tenderness.  No hepatosplenomegaly.  No masses.  No hernias.  SKIN:  No ulcers.  No subcutaneous nodules.  MUSCULOSKELETAL:  No joint swelling.  No clubbing.  NEUROLOGIC: Cranial nerves II-XII are intact. No focal motor deficit. Normal speech and swallowing.  PSYCHIATRY:  The patient is alert and oriented x 3.  Mood and affect were normal.   LABORATORY FINDINGS:  EKG showed marked sinus bradycardia at a rate of 49 per minute.  Otherwise unremarkable EKG.  Chest x-ray showed chronic diffuse interstitial lung disease.  CAT scan of the head showed no acute intracranial abnormalities.  Serum glucose 92, BUN 14, creatinine 0.8, sodium 139, potassium 4, calcium 8.4.  Liver function tests and liver transaminases were normal.  Total CPK 42, troponin less than 0.02.  Digoxin was 1.09.  CBC showed a white count of 7000, hemoglobin 12,  hematocrit 39, platelet count 140.  Urinalysis was unremarkable.   ASSESSMENT: 1.  Dizziness with positive orthostatic blood pressure changes, may indicate underlying dehydration.  2.  Systemic hypertension.  3.  Paroxysmal atrial fibrillation, but right now she is in sinus rhythm.  4.  Coronary artery disease, status post stent placement.  5.  Hyperlipidemia.  6.  Bronchiectasis and chronic interstitial lung disease, likely from working in the [place] for about 15 years.   7.  Noted bradycardia or sinus bradycardia, may or may not be contributing to her symptoms.   PLAN:  We will admit the patient for observation.  Gentle IV hydration.  I will hold the hydrochlorothiazide and I will also hold the digoxin to minimize the bradycardia.  I will change her longer acting metoprolol to once a day.  Continue the rest of her home medications.  Neuro checks q. 2 hours, although at the time being  there are no symptoms of any neurologic deficit.   TIME SPENT IN EVALUATING THIS PATIENT, REVIEWING MEDICAL RECORDS AND DISCUSSING HER CASE WITH HER SONS:  Took more than 55 minutes.   CODE STATUS:   THE PATIENT'S CODE STATUS IS FULL CODE.     ____________________________ Carney CornersAmir M. Rudene Rearwish, MD amd:ea D: 08/15/2012 00:30:39 ET T: 08/15/2012 01:27:29 ET JOB#: [id number]  cc: Carney CornersAmir M. Rudene Rearwish, MD, <Dictator> Zollie ScaleAMIR M Celedonio Sortino MD ELECTRONICALLY SIGNED 08/15/2012 6:37

## 2014-10-30 NOTE — Consult Note (Signed)
PATIENT NAME:  Karen Bradshaw, Karen Bradshaw MR#:  [id number] DATE OF BIRTH:  10-26-1919  DATE OF CONSULTATION:  07/01/2014  CONSULTING PHYSICIAN:  Lamar BlinksBruce J. Zade Falkner, MD   CONSULTING PHYSICIAN:  Dr. Oneita HurtAlice Webster, M.D.   REASON FOR CONSULTATION: Chest pain with known coronary artery disease status post stenting with elevated troponin and history of paroxysmal atrial fibrillation.   CHIEF COMPLAINT: Chest pain.  HISTORY OF PRESENT ILLNESS:  This is a 81 year old female who has had new onset left-sided chest discomfort and radiation into her substernal with shortness of breath over the last two days, increasing in frequency with associated shortness of breath and weakness. The patient has been seen in the Emergency Room with an x-ray suggesting possible pneumonia and an EKG showing normal sinus rhythm with nonspecific ST changes. No evidence of myocardial infarction, although the patient does have a slight elevation of troponin of 0.14 because she has had the history of paroxysmal atrial fibrillation, but currently in normal rhythm. She does have essential hypertension, mixed hyperlipidemia, previously, stable. Currently, she has no further episodes of chest discomfort and is improving at this time.   REVIEW OF SYSTEMS: The remainder of review of systems difficult to assess due to dementia.   PAST MEDICAL HISTORY:  1.  Coronary artery disease.  2.  Dementia.  3.  Atrial fibrillation.  4.  Hypertension.   FAMILY HISTORY: Her son has coronary artery disease.   SOCIAL HISTORY: Currently denies alcohol or tobacco use.   ALLERGIES:  No known drug allergies.   CURRENT MEDICATIONS: As listed.   PHYSICAL EXAMINATION:  VITAL SIGNS: Blood pressure is 110/68 bilaterally. Heart rate 72 upright, reclining, and regular.   GENERAL: She is a well-appearing elderly female in no acute distress.   HEENT: No icterus, thyromegaly, ulcers, hemorrhage, or xanthelasma.   CARDIOVASCULAR: Regular rate and rhythm with  normal S1 and S2 and apparent murmur, gallop, or rub. PMI is inferiorly displaced. Carotid upstroke normal without bruit. Jugular venous pressure is normal.   LUNGS: Have few rhonchi and a few wheezes.   ABDOMEN: Soft, nontender, without hepatosplenomegaly or masses. Abdominal aorta is normal size without bruit.   EXTREMITIES: Show 2+ radial, femoral Pedal pulses without lower extremity edema, cyanosis, clubbing or ulcers.   NEUROLOGIC: She is not oriented at this time.    ASSESSMENT: A 81 year old female with precordial chest discomfort, coronary artery disease status post stent placement with history of mixed hyperlipidemia, essential hypertension, and nonvalvular paroxysmal atrial fibrillation having pneumonia and elevated troponin, which may be possible for myocardial infarction or non-ST elevation myocardial infarction.   RECOMMENDATIONS:  1.  Further serial ECG and enzymes to assess for possible myocardial infarction versus demand ischemia due to pneumonia.  2.  Antibiotics and inhalers for treatment of lung disease and/or pneumonia.  3.  Continue treatment of essential hypertension and mixed hyperlipidemia for further risk reduction of cardiovascular disease.  4.  Aspirin for further risk reduction in cardiovascular event and/or myocardial infarction.  5.  Begin ambulation  for any further significant symptoms and further diagnostic testing, if necessary based on that ambulation but no further diagnostic testing necessary at this time.    PATIENT NAME:  ____________________________ Lamar BlinksBruce J. Trevell Pariseau, MD bjk:at D: 07/01/2014 08:04:53 ET T: 07/01/2014 08:59:35 ET JOB#: [id number]  cc: Lamar BlinksBruce J. Yatziri Wainwright, MD, <Dictator> Lamar BlinksBRUCE J Claretha Townshend MD ELECTRONICALLY SIGNED 07/07/2014 13:47

## 2014-10-30 NOTE — H&P (Signed)
PATIENT NAME:  Karen Bradshaw, Karen Bradshaw MR#:  [id number] DATE OF BIRTH:  Feb 23, 1920  DATE OF ADMISSION:  07/01/2014  REFERRING DOCTOR: Rebecka Apley, MD  PRIMARY CARE DOCTOR: Danella Penton, MD, of [facility].  CHIEF COMPLAINT: Left-sided chest pain with radiation to the left arm.    HISTORY OF PRESENT ILLNESS: A 81 year old Caucasian female a resident of [facility] was brought to the Emergency Room by EMS with the complaints of left-sided chest pain with radiation to the left arm which started around 10:30 p.m. last night. According to the patient's sons, who are with the patient at this time, the patient was doing well and in her usual state of health until yesterday at night around 10:30 p.m. she developed left-sided chest pain which radiated to the left arm. Hence, EMS was called, who brought her to the Emergency Room for further evaluation. The patient continued to have the chest pain until she got to the Emergency Room and after and she got to the Emergency Room the chest pain was relieved. In the Emergency Room, the patient was evaluated by the ED physician and workup was essentially negative with the first set of troponin within normal limits. The patient was given aspirin observed in the Emergency Room. Her second set of troponin came back elevated at 0.14 and a chest x-ray was also significant for some the right upper lobe airspace disease suggestive of early pneumonia. The patient denies any cough. No fever. No sputum production. The patient denies any nausea, vomiting, diarrhea, abdominal pain. No dysuria. No frequency, urgency. The patient has a history of dementia and is not able to give an accurate history. Denies any complaints including chest pain, shortness of breath, dizziness, or syncopal episodes at this time.    PAST MEDICAL HISTORY:  1.  Alzheimer dementia.  2.  Paroxysmal atrial fibrillation.  3.  History of hypertension.  4.  History of coronary artery  disease status post stent.  5.  History of COPD.  6.  History of depression.   PAST SURGICAL HISTORY:  1.  Status post hysterectomy.  2.  Status post tonsillectomy.   ALLERGIES: SULFA, ERYTHROMYCIN, PENICILLIN.   HOME MEDICATIONS:  1.  Aspirin 81 mg 1 tablet p.o. daily.  2.  Hydrochlorothiazide 25 mg tablet 1 tablet orally once a day.  3.  Loratadine 10 mg tablet 1 tablet orally once a day.  4.  Mapap 500 mg tablet 2 tablets 3 times a day as needed.  5.  Metoprolol succinate 25 mg tablet half tablet orally 2 times a day.  6.  Nitro-Bid as needed.   7.  Pantoprazole 40 mg tablet 1 tablet orally once a day.  8.  Plavix 75 mg 1 tablet orally once a day.  9.  Sertraline 50 mg tablet 1 tablet orally once a day.  10.  Vitamin B12 at 500 mcg tablet 1 tablet orally once a day.  11.  hydrochlorothiazide 10 mg oral tablet 1 tablet 2 times a day   FAMILY HISTORY: Significant for both sons with coronary artery disease.   SOCIAL HISTORY: She is single. She is a resident of [facility]. She is on total care dependent because of Alzheimer dementia. No history of smoking, alcohol, or substance abuse.    REVIEW OF SYSTEMS:  CONSTITUTIONAL: Negative for fever, fatigue, or generalized weakness.  EYES: Negative for blurred vision, double vision. No pain. No redness. No inflammation.  EARS, NOSE, AND THROAT: Negative for tinnitus,  ear pain. Hearing loss is present. No epistaxis. No nasal discharge.  RESPIRATORY: Negative for cough, wheezing, hemoptysis, dyspnea, painful respirations.  CARDIOVASCULAR: Positive for left-sided chest pain with radiation to the left arm. No palpitations. No syncopal episodes. No dizziness.  GASTROINTESTINAL: Negative for nausea, vomiting, diarrhea, abdominal pain, hematemesis, melena, GERD symptoms.  GENITOURINARY: Negative for dysuria, hematuria, frequency, or urgency.  ENDOCRINE: Negative for polyuria, polydipsia. No heat or cold intolerance.   HEMATOLOGIC: Negative for anemia, easy bruising or bleeding.   INTEGUMENTARY: Negative for acne, skin rash, or lesions.  MUSCULOSKELETAL: History of some arthritic pains for which she takes p.r.n. pain medications.  NEUROLOGICAL: Negative for focal weakness or numbness. No history of CVA, TIA, seizure disorder.   PSYCHIATRIC: Positive for history of depression for which she takes medications.   PHYSICAL EXAMINATION:  VITAL SIGNS: Temperature 98 degrees Fahrenheit, pulse rate 62 per minute, respirations 20 per minute, blood pressure on admission 175/86, current blood pressure 138/79, oxygen saturation 94% on room air.  GENERAL: Well-developed, well-nourished, elderly lady, alert, awake, and oriented x 1. In no acute distress, comfortably resting in the bed.  HEAD: Atraumatic, normocephalic.  EYES: Pupils are equal, react to light and accommodation. No conjunctival pallor. No scleral icterus. Extraocular movements intact.   NOSE: No nasal lesions. No drainage.  EARS: No drainage. No external lesions.  ORAL CAVITY: No mucosal lesions. No exudates.  NECK: Supple. No JVD. No thyromegaly. No carotid bruit. Range of motion of neck normal.  RESPIRATORY: Good respiratory effort. Bilateral vesicular breath sounds present. No rales or rhonchi present.  CARDIOVASCULAR: S1 and S2, regular. No murmurs, gallops, or clicks. Pulses are equal at carotid, femoral, and pedal pulses. No peripheral edema.  GASTROINTESTINAL: Abdomen is soft, nontender. No hepatosplenomegaly. Bowel sounds present and equal in all 4 quadrants. No guarding. No rigidity. No rebound.   GENITOURINARY: Deferred.  MUSCULOSKELETAL: Range of motion adequate in all areas. Strength and tone equal bilaterally.  SKIN: Inspection within normal limits.  LYMPHATIC: No cervical lymphadenopathy.  VASCULAR: Good dorsalis pedis and posterior tibial pulses.  NEUROLOGIC: Alert, awake, and oriented x 1. Cranial nerves II-XII grossly intact. DTRs 2+   symmetrical bilaterally in both upper and lower extremities. Motor strength is 5/5 in both upper and lower extremities.  PSYCHIATRIC: The patient has dementia. Memory is impaired.   LABORATORY DATA: Serum glucose 96, BUN 12, creatinine 0.9, sodium 138, potassium 4.5, chloride 102, bicarbonate 29, total calcium 8.6, total protein 7.0, albumin 2.4, total bilirubin 0.3, alkaline phosphatase 120, AST 25, and ALT is 14. Troponin first set 0.03. WBC 6.8, hemoglobin 13.6, hematocrit 41.8, platelet count 150,000. Second set of troponin elevated at 0.14.   IMAGING STUDIES:  Chest x-ray:  1.  Right upper lobe airspace opacity raising question of mild pneumonia.  2.  Finding of COPD with underlying chronic lung changes seen.   EKG: Normal sinus rhythm with ventricular rate of 60 beats per minute, evidence of septal infarct present.   ASSESSMENT AND PLAN: A 81 year old Caucasian female, a resident of [facility], with past medical history of Alzheimer dementia, hypertension, coronary artery disease status post stent, history of proximal atrial fibrillation, chronic obstructive pulmonary disease, depression was brought to the Emergency Room by EMS with the complaints of left-sided chest pain with radiation to the left arm.  1.  Left-sided chest pain with radiation and left arm with elevated second set of troponin in a patient with a coronary artery disease. Acute coronary syndrome.  PLAN: Admit to  telemetry. Cycle cardiac enzymes. Aspirin, nitroglycerin, beta blocker, statin, and heparin drip. Order echocardiogram and cardiology consultation.  2.  Right upper lobe pneumonia, mild: Continue p.o. Levaquin.  3.  Hypertension on home medications, stable: Continue same.  4.  History of paroxysmal atrial fibrillation currently in sinus rhythm: The patient is stable. Monitor.  5.  History of chronic obstructive pulmonary disease/asthma stable: Continue p.r.n. nebulizers.  6.  History of  hyperlipidemia, stable: Continue statin.  7.  History of Alzheimer dementia on supportive care: Continue supportive care.  8.  Deep vein thrombosis prophylaxis: Heparin drip.  9.  Gastrointestinal prophylaxis: Protonix.   CODE STATUS: Full code.   TIME SPENT: 55 minutes.    ____________________________ Crissie FiguresEdavally N. Shalane Florendo, MD enr:bm D: 07/01/2014 06:29:51 ET T: 07/01/2014 07:00:09 ET JOB#: [id number]  cc: Crissie FiguresEdavally N. Xitlali Kastens, MD, <Dictator> Danella PentonMark F. Miller, MD Crissie FiguresEDAVALLY N Falisha Osment MD ELECTRONICALLY SIGNED 07/01/2014 20:35

## 2014-10-31 NOTE — Consult Note (Signed)
Brief Consult Note: Diagnosis: Diverticulitis.   Patient was seen by consultant.   Consult note dictated.   Discussed with Attending MD.   Comments: Appreciate consult for 81 y/o caucasian woman admitted for diverticulitis for possible upper GI bleed. Medications significant for Aleve, ASA, Plavix. Denies epigastric discomfort and other GERD symptoms. Has been on PPI at home: recently changed to Dexilant.  Only  complaints are some LLQ pain that she states has improved greatly since hospitalization, and that her tongue feels like it is burning.  Abdomen today is very soft with minimal lt lower quadrant tenderness. Rectum without significant abnormalities- stool pink in scant amounts, heme positive. Noted macrocytic anemia on labs- patient also report recent weight loss. EGD 2007 with gastritis, no H pylori, metaplasia/dysplasia/malignancy. Colonoscopy 2010 with diverticulosis.  Impression: History of hematemesis and epigastric discomfort per admitting note. None today. With complex history and age, will plan for EGD tomorrow if clinically feasible  Electronic Signatures: Vevelyn PatLondon, Marieclaire Bettenhausen H (NP)  (Signed [phone]-Feb-13 17:32)  Authored: Brief Consult Note   Last Updated: 26-Feb-13 17:32 by Keturah BarreLondon, Javonna Balli H (NP)

## 2014-10-31 NOTE — Consult Note (Signed)
Chief Complaint:   Subjective/Chief Complaint denies abdominal pain.  some swelling of the lips.  tolerating regular diet.  no further rectal bleeding.   VITAL SIGNS/ANCILLARY NOTES: **Vital Signs.:   28-Feb-13 13:46   Temperature Temperature (F) 98.6   Celsius 37   Pulse Pulse 60   Respirations Respirations 20   Systolic BP Systolic BP 97   Diastolic BP (mmHg) Diastolic BP (mmHg) 56   Mean BP 69   Pulse Ox % Pulse Ox % 92   Pulse Ox Activity Level  At rest   Oxygen Delivery Room Air/ 21 %  *Intake and Output.:   28-Feb-13 01:30   Stool  MED LOOSE STOOL   Brief Assessment:   Cardiac Regular    Respiratory clear BS    Gastrointestinal details normal Soft  Nontender  Nondistended  No masses palpable  Bowel sounds normal   Routine Sero:  27-Feb-13 04:41    Occult Blood, Feces NEGATIVE  Routine Hem:  27-Feb-13 04:48    WBC (CBC) 4.8   RBC (CBC) 3.13   Hemoglobin (CBC) 11.1   Hematocrit (CBC) 34.3   Platelet Count (CBC) 128   MCV 110   MCH 35.4   MCHC 32.4   RDW 17.1   Neutrophil % 51.0   Lymphocyte % 29.1   Monocyte % 11.0   Eosinophil % 8.2   Basophil % 0.7   Neutrophil # 2.5   Lymphocyte # 1.4   Monocyte # 0.5   Eosinophil # 0.4   Basophil # 0.0  Routine Chem:  27-Feb-13 04:48    Glucose, Serum 89   BUN 10   Creatinine (comp) 0.93   Sodium, Serum 146   Potassium, Serum 3.8   Chloride, Serum 107   CO2, Serum 28   Calcium (Total), Serum 8.6   Osmolality (calc) 289   eGFR (African American) >60   eGFR (Non-African American) >60   Anion Gap 11  Routine UA:  27-Feb-13 08:28    Clarity (UA) Clear   Glucose (UA) Negative   Bilirubin (UA) Negative   Ketones (UA) Negative   Specific Gravity (UA) 1.005   Blood (UA) Negative   pH (UA) 7.0   Protein (UA) Negative   Nitrite (UA) Negative   Leukocyte Esterase (UA) Negative   RBC (UA) 1 /HPF   Assessment/Plan:  Assessment/Plan:   Assessment 1) acute diverticulitis-improving, tolerating po.  no  evidence of mesenteric ischemia.    Plan 1) would finish a 10 day course of abx.  add align probiotic in a day or so noe dose daily.  will follow at a distance.   Electronic Signatures: Loistine Simas (MD)  (Signed [phone] 16:55)  Authored: Chief Complaint, VITAL SIGNS/ANCILLARY NOTES, Brief Assessment, Lab Results, Assessment/Plan   Last Updated: 28-Feb-13 16:55 by Loistine Simas (MD)

## 2014-10-31 NOTE — Discharge Summary (Signed)
PATIENT NAME:  Karen Bradshaw, Karen Bradshaw MR#:  [id number] DATE OF BIRTH:  1920/03/07  DATE OF ADMISSION:  09/03/2011 DATE OF DISCHARGE:  09/07/2011  DISCHARGE DIAGNOSES:  1. Acute diverticulitis.  2. Acute upper gastrointestinal bleed with anemia.  3. History of atrial fibrillation.  4. Coronary artery disease.  5. Bronchiectasis.  6. Asthma.   DISCHARGE MEDICATIONS:  1. Tylenol 650 mg every 4 hours p.Bradshaw.n.  2. Digoxin 0.125 mg daily.  3. Toprol-XL 25 mg twice a day. 4. Nystatin solution 5 mL every 6 hours x7 days.  5. Protonix 40 mg daily.  6. Flora-Q 1 tab twice a day. 7. Levaquin 250 mg daily x7 days.  8. Flagyl 250 mg three times daily x7 days.   REASON FOR ADMISSION: This is an 81 year old female who presents with severe abdominal pain, nausea, vomiting and melena. Please see the History and Physical for history of present illness, past medical history, and physical exam.   HOSPITAL COURSE: The patient was admitted. Her aspirin and Plavix were discontinued. Toprol-XL was continued to control her atrial fibrillation rate. Her hemoglobin dropped down to 10.6 and was at a level of 10.8 on discharge. She underwent abdominal pelvic CT scanning which showed significant renal artery stenosis, otherwise no ischemic bowel. She was given benazepril for blood pressure and that dropped her blood pressure too low and is allergic to ACE inhibitors. She responded to antibiotics, although IV Levaquin caused some altered mental status. She had some swelling of her lips, thought really unclear etiology. Rocephin was     discontinued. Low dose p.o. Levaquin will be used. Nystatin solution will be used. Flora-Q will be used as well to retard the chance of C. difficile. She is very weak and will be going to skilled nursing.  ____________________________ Karen PentonMark F. Alyx Mcguirk, MD mfm:slb D: 09/07/2011 07:44:12 ET T: 09/07/2011 08:08:01 ET JOB#: [id number]  cc: Karen PentonMark F. Annaliz Aven, MD, <Dictator> Karen Loescher Sherlene ShamsF Emelda Kohlbeck  MD ELECTRONICALLY SIGNED 09/12/2011 7:44

## 2014-10-31 NOTE — Consult Note (Signed)
Chief Complaint:   Subjective/Chief Complaint patient seen and examined, chart reviewed.  On further discussion with patient she denies bloody emesis or even coffee ground./[employer] appearance.  No pain in the epigastrum when distracted, but definate left abdominal pain, extending from above the midline to the llq.  Pain preceeded the rectal bleeding, this noted "around the stool" initially.  Concern for possible mesenteric ischemia/ colonic ischemia.  Will hold arrangements for egd or ugis, plan for contrasted ct tomorrow.  Also changing diet to full liquids for now.   Further recs to follow.   Electronic Signatures: Barnetta ChapelSkulskie, Aaro Meyers (MD)  (Signed [phone]-Feb-13 18:09)  Authored: Chief Complaint   Last Updated: 26-Feb-13 18:09 by Barnetta ChapelSkulskie, Cuahutemoc Attar (MD)

## 2014-10-31 NOTE — H&P (Signed)
PATIENT NAME:  Karen Bradshaw, Karen Bradshaw MR#:  [id number] DATE OF BIRTH:  08-12-1919  DATE OF ADMISSION:  09/03/2011  HISTORY OF PRESENT ILLNESS:  81 year old female presents with progressive left lower quadrant pain despite outpatient management, consistent with progressive diverticulitis. She has a known history of diverticulitis. She was seen in the office on Friday with some mild hematemesis, melena in her stool, and progressive left lower quadrant pain. She was put on b.i.d. Dexilant, was given Rocephin and Augmentin over the weekend. She has not eaten much. Her bowels have not moved. Her abdominal pain in the left lower quadrant has progressed. In light of the declining status and her age and the fact that she lives alone she will be admitted for further evaluation and treatment.   PAST MEDICAL HISTORY:  1. Asthma.  2. History of atrial fibrillation.  3. Hyperlipidemia.  4. Coronary artery disease.  5. Bronchiectasis.   PAST SURGICAL HISTORY:  1. Hysterectomy.  2. Laryngotomy.  3. Tonsillectomy. 4. Post cardiac stent placement.   SOCIAL HISTORY: Has four boys, lives alone, is widowed.   FAMILY HISTORY: Noncontributory.   REVIEW OF SYSTEMS: As above, otherwise negative.   PHYSICAL EXAMINATION:  VITAL SIGNS: Blood pressure 98/50, pulse 100 and irregular. Weight 129.   NECK: No JVD.   LUNGS: Clear to auscultation and percussion.   HEART: Regular rhythm, 2/6 systolic murmur at the apex.   ABDOMEN: Bowel sounds present, but diminished. Mild to moderate epigastric tenderness with mild to moderate left lower quadrant tenderness. No rebound.   EXTREMITIES: No edema.   SKIN: No rash.   NEUROLOGIC: Grossly nonfocal.   ASSESSMENT AND PLAN:  1. Acute diverticulitis: Progressive despite outpatient oral antibiotics. We will use IV Levaquin and Rocephin and follow closely. No sign for obstruction.  2. Epigastric pain:  Some evidence for upper GI bleed. We will check Hemoccults. May need  EGD. We will follow blood counts, holding Plavix for now. 3. History of atrial fibrillation: Continue Toprol-XL.    ____________________________ Danella PentonMark F. Chrysten Woulfe, MD mfm:bjt D: 09/03/2011 12:42:51 ET T: 09/03/2011 13:00:54 ET JOB#: [id number]  cc: Danella PentonMark F. Cordon Gassett, MD, <Dictator> Laurene Melendrez Sherlene ShamsF Annaliz Aven MD ELECTRONICALLY SIGNED 09/05/2011 13:38

## 2014-10-31 NOTE — Consult Note (Signed)
Chief Complaint:   Subjective/Chief Complaint less abd pain, 3/10.  no further rectal bleeding , only one small bm.  tolerating full liquids.   VITAL SIGNS/ANCILLARY NOTES: **Vital Signs.:   27-Feb-13 10:17   Temperature Temperature (F) 98.4   Celsius 36.8   Pulse Pulse 59   Respirations Respirations 18   Systolic BP Systolic BP 343   Diastolic BP (mmHg) Diastolic BP (mmHg) 72   Mean BP 105   Pulse Ox % Pulse Ox % 94   Pulse Ox Activity Level  At rest   Oxygen Delivery Room Air/ 21 %  *Intake and Output.:   27-Feb-13 10:15   Stool  very small amount of soft, brown stool (no s/s of bleeding)   Brief Assessment:   Cardiac Regular    Respiratory clear BS    Gastrointestinal details normal Soft  Nondistended  No masses palpable  Bowel sounds normal  No rebound tenderness  mild left abd/llq discomfort to palpation, no epigastric discomfort.   Routine Sero:  27-Feb-13 04:41    Occult Blood, Feces NEGATIVE  Routine Hem:  27-Feb-13 04:48    WBC (CBC) 4.8   RBC (CBC) 3.13   Hemoglobin (CBC) 11.1   Hematocrit (CBC) 34.3   Platelet Count (CBC) 128   MCV 110   MCH 35.4   MCHC 32.4   RDW 17.1   Neutrophil % 51.0   Lymphocyte % 29.1   Monocyte % 11.0   Eosinophil % 8.2   Basophil % 0.7   Neutrophil # 2.5   Lymphocyte # 1.4   Monocyte # 0.5   Eosinophil # 0.4   Basophil # 0.0  Routine Chem:  27-Feb-13 04:48    Glucose, Serum 89   BUN 10   Creatinine (comp) 0.93   Sodium, Serum 146   Potassium, Serum 3.8   Chloride, Serum 107   CO2, Serum 28   Calcium (Total), Serum 8.6   Osmolality (calc) 289   eGFR (African American) >60   eGFR (Non-African American) >60   Anion Gap 11  Routine UA:  27-Feb-13 08:28    Clarity (UA) Clear   Glucose (UA) Negative   Bilirubin (UA) Negative   Ketones (UA) Negative   Specific Gravity (UA) 1.005   Blood (UA) Negative   pH (UA) 7.0   Protein (UA) Negative   Nitrite (UA) Negative   Leukocyte Esterase (UA) Negative   RBC (UA) 1  /HPF   Radiology Results: CT:    27-Feb-13 08:47, CT Abdomen and Pelvis With Contrast   CT Abdomen and Pelvis With Contrast    REASON FOR EXAM:    (1) left abdominal pain, possible mesenteric   insufficiency; (2) left abdominal p  COMMENTS:       PROCEDURE: CT  - CT ABDOMEN / PELVIS  W  - Sep 05 2011  8:47AM     RESULT: CT of the abdomen and pelvis is performed with 75 mL of  Isovue-370 iodinated intravenous contrast and oral contrast. The stated   differential consideration of possible mesenteric ischemia would   typically be investigated with CT arteriographic protocol of the   mesenteric vessels and aorta rather thanroutine CT with oral and IV   contrast. Images are reconstructed at 3.0 mm slice thickness in the axial   plane and compared to the previous examination dated December 24, 2008. Thin   slice multiplanar reconstructions were created utilizing the Wilcox.  Prominent atherosclerotic calcification is present in the right  renal   artery proximally causing what appears to be severe stenosis. Significant   atherosclerotic calcification is noted at the proximal left renal artery.   Neither kidney shows severe cortical thinning. Arteriographic   investigation may be warranted for consideration of renal angioplasty   especially on the right. There is some atherosclerotic calcification near   the origin of the superior mesenteric artery but visually there does not   appear to be significant narrowing. The inferior mesenteric is   opacifying. No abnormal bowel distention or bowel wall thickening is   evident. There is no evidence of pneumatosis. The liver, spleen,   pancreas, gallbladder, kidneys and adrenal glands appear to be within   normal limits. The abdominal aorta has a significant amount of   atherosclerotic calcified plaque without aneurysmal dilation. There is no   ascites or abnormal fluid collection. The urinary bladder is   unremarkable. It appears the  patient is status post hysterectomy. No     adnexal mass is evident. The ovaries appear to be present. Correlate with   surgical history. The abdominal wall is intact. The lung bases appear   clear.    IMPRESSION:   1. Significant atherosclerotic disease without definite findings of   severe mesenteric stenosis or mesenteric ischemia. There is a prominent   amount of calcified plaque in the origin of the right renal artery for   which further investigation should be considered, especially if the   patient is hypertensive. Less prominent atherosclerotic calcified plaque   is seen in the origin of the left renal artery.  2. Note is made incidentally of a scoliotic curvature concave to the   right centered in the L4 level.    Thank you for the opportunity to contribute to the care of your patient.     Verified By: Karen Bradshaw, M.D., MD   Assessment/Plan:  Assessment/Plan:   Assessment 1) left abdominal pain-likely acute diverticulitis.  most recent colonoscopy in 2011 for acute diverticulitis.  CT without evidence of overt mesenteric stenosis, however  with renal art stenosis.  hx of htn noted.    Plan 1) continue current abx.  woudl advance diet tomorrow to low residue.  following.   Electronic Signatures: Karen Bradshaw (MD)  (Signed [phone] 15:10)  Authored: Chief Complaint, VITAL SIGNS/ANCILLARY NOTES, Brief Assessment, Lab Results, Radiology Results, Assessment/Plan   Last Updated: 27-Feb-13 15:10 by Karen Bradshaw (MD)

## 2014-10-31 NOTE — Consult Note (Signed)
PATIENT NAME:  Karen Bradshaw, Karen Bradshaw MR#:  [id number] DATE OF BIRTH:  [birth year]  DATE OF CONSULTATION:  09/04/2011  REFERRING PHYSICIAN:  Dr. Hyacinth Meeker CONSULTING PHYSICIAN:  Keturah Barre, NP  HISTORY OF PRESENT ILLNESS: Karen Bradshaw is an 81 year old Caucasian female admitted with diverticulitis. Please see the admission History and Physical for full details. Her past medical history is significant for asthma, atrial fibrillation, hyperlipidemia, coronary artery disease, bronchiectasis, status post cardiac stent placement. GI has been consulted at the request of Dr. Hyacinth Meeker to evaluate for possible upper GI bleed. Evidently, according to the clinic note there was a history of some mild hematemesis. Her medications are significant for Plavix, aspirin, and p.r.n. Aleve. The patient denies epigastric pain, nausea, bloating, or excessive gas. She states that her appetite is reasonable. She does, however, state that her tongue feels like it is burning and blistered and thinks she has lost some weight here recently. She states that the only abdominal pain she has is in the left lower quadrant, and that has markedly improved since admission to the hospital. She did have a bowel movement today that in nurse's note did have some blood to it. Otherwise she denies diarrhea, difficulty swallowing, and other gastrointestinal-related complaints. She does seem to have a little bit of memory loss. She did have a colonoscopy in 2010 that revealed diverticulosis. She had an EGD in 2007 by Dr. Servando Snare that demonstrated a normal esophagus, normal duodenum, and some gastritis. It was negative for metaplasia, dysplasia, malignancy, and H. pylori.   PAST MEDICAL HISTORY:  1. Asthma.  2. Atrial fibrillation.  3. Hyperlipidemia  4. Coronary artery disease.  5. Bronchiectasis.   PAST SURGICAL HISTORY:  1. Hysterectomy.  2. Laryngotomy.  3. Tonsillectomy.  4. Post cardiac stent placement by Dr. Juliann Pares.   SOCIAL  HISTORY: Lives alone near her sister,  has four sons. No tobacco, alcohol, or illicits. She likes needlework.   FAMILY HISTORY: No known GI malignancies.   REVIEW OF SYSTEMS: GENERAL: Positive for mild fatigue, questionable weight changes. No fevers. PSYCH: No history of depression or anxiety. HEENT: No migraines, injuries, or persistent headaches. Some decreased visual acuity. No vision changes, dryness, tearing, or redness. No nosebleeds. Tongue as noted. No difficulty swallowing. RESPIRATORY: No shortness of breath, cough, or wheeze. CARDIAC: Has been treated for angina in the past. Currently no chest pain or palpitations. High blood pressure is controlled by medications. History of cardiac stent placement years ago. Abdomen as noted. GU: No dysuria. EXTREMITIES: No unusual muscle or joint pain, swelling, or discoloration. HEME/ENDOCRINE: No history of diabetes or thyroid illness. Does feel cold at times but not abnormally so. NEUROLOGIC: No history of stroke, transient ischemic attack, numbness, tingling, fainting, dizziness, or seizures.   LABS/STUDIES: Most recent lab work: Automotive engineer within normal limits. Hepatic panel within normal limits. Digoxin therapeutic. CBC: WBCs 4.6, hemoglobin 10.6, hematocrit 33.1, platelets 110. Macrocytic anemia noted with MCV 110, MCH 35.7, RDW 17.1. Differential is within normal limits.   ALLERGIES: Erythromycin, penicillin, sulfa.   PHYSICAL EXAMINATION:  VITAL SIGNS: Most recent vital signs: Temperature 97.6, pulse 66, respiratory rate 18, blood pressure 169/67, oxygen saturation 95% on room air.   GENERAL: Well appearing elderly woman, in no acute distress.   PSYCH: Very pleasant, cooperative. Mood stable. Thought logical, somewhat forgetful.  Marland Kitchen HEENT: Normocephalic, atraumatic. No redness, drainage, or inflammation to the eyes or nares. Oral mucosa pink and moist.  Tongue is somewhat beefy red.   NECK: No JVD, lymphadenopathy,  or thyromegaly.    PULMONARY: Respirations eupneic. Lungs clear to auscultation bilaterally.   CARDIAC: S1, S2. 2/6 systolic murmur. No gallops. Peripheral pulses 2+. No edema.   ABDOMEN: Nondistended, active bowel sounds x4. Very soft. Minimal tenderness to the left lower quadrant. Absolutely no epigastric tenderness today. No rebound tenderness, peritoneal signs, hepatosplenomegaly, or hernias.   GU: Normal female genitalia.   RECTAL: No gross abnormalities. Stool pink, heme positive.   EXTREMITIES: Moves all extremities well times four. No cyanosis, clubbing, or edema.   SKIN: Without erythema, lesion, or rash.   NEUROLOGIC: Alert and oriented times three. Cranial nerves II through XII grossly intact. Speech clear. No facial droop.   ASSESSMENT AND PLAN:  1. With history of hematemesis and epigastric discomfort along with Aleve, Plavix, and aspirin, we will plan on EGD tomorrow if clinically feasible. However, noted improvement.  2. With macrocytic anemia and painful tongue we will order RBC, folate, serum B12.   3. Heme- positive stool: The patient was admitted with melena. GI will follow.   These services were provided by Vevelyn Pathristiane Jennings Stirling, MSN, NPC in collaboration with Barnetta ChapelMartin Skulskie, MD.   ____________________________ Keturah Barrehristiane H. Bryli Mantey, NP chl:bjt D: 09/05/2011 13:00:56 ET T: 09/05/2011 13:27:53 ET JOB#: [id number]  cc: Keturah Barrehristiane H. Lavontay Kirk, NP, <Dictator> Eustaquio MaizeHRISTIANE H Numan Zylstra FNP ELECTRONICALLY SIGNED 09/07/2011 14:02

## 2014-11-03 NOTE — Discharge Summary (Signed)
PATIENT NAME:  Karen NortonMCPHERSON, Adler R MR#:  [id number] DATE OF BIRTH:  [birth year]  DATE OF ADMISSION:  07/01/2014 DATE OF DISCHARGE:  07/02/2014   DISCHARGE DIAGNOSIS: Chest pain, noncardiac, that has resolved.   DISCHARGE MEDICATIONS:  1.  Hydrochlorothiazide 25 mg p.o. daily.  2.  Sertraline 50 mg p.o. daily.  3.  Nitroglycerin 0.4 mg sublingual as needed for chest pain, every 5 minutes up to 3 doses.  4.  Aspirin 81 mg p.o. daily.  5.  Plavix 75 mg p.o. daily.  6.  Vitamin B12 500 mcg p.o. daily.  7.  Tylenol 500 mg 2 tablets p.o. t.i.d. as needed for pain.  9.  Loratadine 10 mg p.o. daily as needed for allergies.  10.  Metoprolol tartrate 25 mg p.o. b.i.d.  11.  Atorvastatin 40 mg p.o. daily.   CONSULTS: Cardiology.   PROCEDURES: None.   PERTINENT LABORATORY AND STUDIES: Cardiac Enzymes: Troponin 0.03, 0.14, 0.19. CT of the chest showed chronic lung disease; no acute processes.   BRIEF HOSPITAL COURSE: Acute chest pain. The patient initially came in with complaints of acute chest pain with a history of coronary artery disease and hypertension, with a concern initially for acute coronary syndrome, although had no  EKG changes. Troponins were essentially negative. The patient's chest pain resolved and he was able to ambulate without any issues. CT of the chest conveyed no acute process; therefore, the antibiotics were discontinued. She does have chronic COPD and has chronic lung disease present. She is back to her baseline status. She will return home to her assisted living facility. Follow with Dr. Hyacinth MeekerMiller in 2 weeks. Because of her coronary artery disease, we did add a statin therapy to her regimen and simplified her metoprolol to metoprolol tartrate twice a day; otherwise, no other changes.    ____________________________ Marisue IvanKanhka Karol Skarzynski, MD kl:MT D: 07/02/2014 09:01:30 ET T: 07/02/2014 09:17:55 ET JOB#: [id number]  cc: Marisue IvanKanhka Trenton Verne, MD, <Dictator> Marisue IvanKANHKA Earlin Sweeden  MD ELECTRONICALLY SIGNED 07/22/2014 12:01

## 2015-05-08 ENCOUNTER — Emergency Department
Admission: EM | Admit: 2015-05-08 | Discharge: 2015-05-09 | Disposition: A | Discharge status: Home / Self Care | Payer: Medicare Other | Attending: Emergency Medicine | Admitting: Emergency Medicine

## 2015-05-08 ENCOUNTER — Emergency Department: Payer: Medicare Other

## 2015-05-08 ENCOUNTER — Encounter: Payer: Self-pay | Admitting: Emergency Medicine

## 2015-05-08 DIAGNOSIS — R51 Headache: Secondary | ICD-10-CM | POA: Insufficient documentation

## 2015-05-08 DIAGNOSIS — I4891 Unspecified atrial fibrillation: Secondary | ICD-10-CM

## 2015-05-08 DIAGNOSIS — R519 Headache, unspecified: Secondary | ICD-10-CM

## 2015-05-08 HISTORY — DX: Major depressive disorder, single episode, unspecified: F32.9

## 2015-05-08 HISTORY — DX: Unspecified atrial fibrillation: I48.91

## 2015-05-08 HISTORY — DX: Atherosclerotic heart disease of native coronary artery without angina pectoris: I25.10

## 2015-05-08 HISTORY — DX: Chronic obstructive pulmonary disease, unspecified: J44.9

## 2015-05-08 HISTORY — DX: Depression, unspecified: F32.A

## 2015-05-08 HISTORY — DX: Unspecified dementia, unspecified severity, without behavioral disturbance, psychotic disturbance, mood disturbance, and anxiety: F03.90

## 2015-05-08 HISTORY — DX: Essential (primary) hypertension: I10

## 2015-05-08 LAB — CBC
HEMATOCRIT: 37.4 % (ref 35.0–47.0)
Hemoglobin: 12.4 g/dL (ref 12.0–16.0)
MCH: 31.4 pg (ref 26.0–34.0)
MCHC: 33.2 g/dL (ref 32.0–36.0)
MCV: 94.8 fL (ref 80.0–100.0)
Platelets: 151 10*3/uL (ref 150–440)
RBC: 3.94 MIL/uL (ref 3.80–5.20)
RDW: 14.3 % (ref 11.5–14.5)
WBC: 6.8 10*3/uL (ref 3.6–11.0)

## 2015-05-08 MED ORDER — METOPROLOL TARTRATE 25 MG PO TABS
25.0000 mg | ORAL_TABLET | Freq: Once | ORAL | Status: AC
  Administered 2015-05-08: 25 mg via ORAL
  Filled 2015-05-08: qty 1

## 2015-05-08 MED ORDER — METOPROLOL TARTRATE 25 MG PO TABS: 25.0000 mg | ORAL_TABLET | Freq: Once | ORAL | Status: DC

## 2015-05-08 NOTE — ED Notes (Signed)
Pt to ed with c/o headache [facility]/o headache at this time

## 2015-05-08 NOTE — ED Provider Notes (Signed)
[facility] Emergency Department Provider Note     Time seen: ----------------------------------------- 10:03 PM on 05/08/2015 -----------------------------------------  L5 caveat: Review of systems and history cannot be obtained due to dementia.  I have reviewed the triage vital signs and the nursing notes.   HISTORY  Chief Complaint No chief complaint on file.    HPI Karen Bradshaw is a 81 y.o. female who presents ER for complaint of headache at the nursing home. She doesn't history of Alzheimer's dementia, was given Tylenol the headache has resolved. In route she was noted to be tachycardic with a heart rate of 170 in rapid A. Fib. She denies any complaints at this time.   No past medical history on file.  There are no active problems to display for this patient.   No past surgical history on file.  Allergies Review of patient's allergies indicates not on file.  Social History Social History  Substance Use Topics  . Smoking status: Not on file  . Smokeless tobacco: Not on file  . Alcohol Use: Not on file    Review of Systems Neurological: positive for headache  10-point ROS is otherwise unknown  ____________________________________________   PHYSICAL EXAM:  VITAL SIGNS: ED Triage Vitals  Enc Vitals Group     BP --      Pulse --      Resp --      Temp --      Temp src --      SpO2 --      Weight --      Height --      Head Cir --      Peak Flow --      Pain Score --      Pain Loc --      Pain Edu? --      Excl. in GC? --     Constitutional: Alert but disoriented. Well appearing and in no distress. Eyes: Conjunctivae are normal. PERRL. Normal extraocular movements. ENT   Head: Normocephalic and atraumatic.   Nose: No congestion/rhinnorhea.   Mouth/Throat: Mucous membranes are moist.   Neck: No stridor. Cardiovascular: rapid rate, irregular rhythm. Normal and symmetric distal pulses are present in  all extremities. No murmurs, rubs, or gallops. Respiratory: Normal respiratory effort without tachypnea nor retractions. Breath sounds are clear and equal bilaterally. No wheezes/rales/rhonchi. Gastrointestinal: Soft and nontender. No distention. No abdominal bruits.  Musculoskeletal: Nontender with normal range of motion in all extremities. No joint effusions.  No lower extremity tenderness nor edema. Neurologic:  Normal speech and language. No gross focal neurologic deficits are appreciated. Speech is normal. Skin:  Skin is warm, dry and intact. No rash noted. Psychiatric: Mood and affect are normal. ____________________________________________  EKG: Interpreted by me.a 2 fibrillation with a rapid ventricular response, rate is 141 bpm, normal QRS with, normal QT interval. Likely septal infarct age indeterminate.  ____________________________________________  ED COURSE:  Pertinent labs & imaging results that were available during my care of the patient were reviewed by me and considered in my medical decision making (see chart for details). We'll check basic labs, obtain EKG and CT imaging. ____________________________________________    LABS (pertinent positives/negatives)  Labs Reviewed  CBC  BASIC METABOLIC PANEL  TROPONIN I    RADIOLOGY Images were viewed by me  CT head  ____________________________________________  FINAL ASSESSMENT AND PLAN  Headache, atrial fibrillation  Plan: Patient with labs and imaging as dictated above. Patient is in no acute distress, disposition  will be checked out to Dr. Lucrezia EuropeAllison Webster.   Emily FilbertWilliams, Jonathan E, MD   Emily FilbertJonathan E Williams, MD 05/08/15 [id number]

## 2015-05-09 LAB — BASIC METABOLIC PANEL
Anion gap: 8 (ref 5–15)
BUN: 13 mg/dL (ref 6–20)
CALCIUM: 8.8 mg/dL — AB (ref 8.9–10.3)
CO2: 27 mmol/L (ref 22–32)
CREATININE: 0.8 mg/dL (ref 0.44–1.00)
Chloride: 99 mmol/L — ABNORMAL LOW (ref 101–111)
GFR calc Af Amer: >60 mL/min (ref >60)
GLUCOSE: 107 mg/dL — AB (ref 65–99)
Potassium: 3.8 mmol/L (ref 3.5–5.1)
SODIUM: 134 mmol/L — AB (ref 135–145)

## 2015-05-09 LAB — TROPONIN I

## 2015-05-09 NOTE — Discharge Instructions (Signed)

## 2015-05-09 NOTE — ED Notes (Signed)
[facility]

## 2015-05-09 NOTE — ED Notes (Signed)
Patient with no complaints at this time. Respirations even and unlabored. Skin warm/dry. Discharge instructions reviewed with patient at this time. Patient given opportunity to voice concerns/ask questions. Patient discharged at this time and left Emergency Department, in ambulance.

## 2015-05-09 NOTE — ED Provider Notes (Signed)
-----------------------------------------   12:41 AM on 05/09/2015 -----------------------------------------   Blood pressure 128/90, pulse 82, temperature 98.3 F (36.8 C), temperature source Oral, resp. rate 17, SpO2 95 %.  Assuming care from Dr. Mayford KnifeWilliams.  In short, Karen Bradshaw is a 81 y.o. female with a chief complaint of Headache .  Refer to the original H&P for additional details.  The current plan of care is to follow-up the blood work results and reassess the patient's vital signs.  The patient's blood work is unremarkable. The patient does not have an elevated white blood cell count and her troponin is negative. The patient's creatinine is also unremarkable. At this time I feel the patient is stable for discharge. Her heart rate is 82 which is must improved from the tachycardia of 133 which she had previously. The patient's headache is also improved.   Rebecka ApleyAllison P Webster, MD 05/09/15 [phone]

## 2015-11-09 IMAGING — CT CT HEAD W/O CM
2 of 3 series · 16 of 30 positions shown, 18 images · non-contrast
Comparison: CT head August 14, 2012

CLINICAL DATA: Head pressure beginning this evening. History of
dementia, atrial fibrillation, hypertension.

EXAM:
CT HEAD WITHOUT CONTRAST
TECHNIQUE: Contiguous axial images were obtained from the base of the skull
through the vertex without intravenous contrast.

[Series 2: soft tissue · axial · 0.41mm/px · z∈[-482,-372]mm · 8 of 30 slices shown]
[im 4/30  brain]
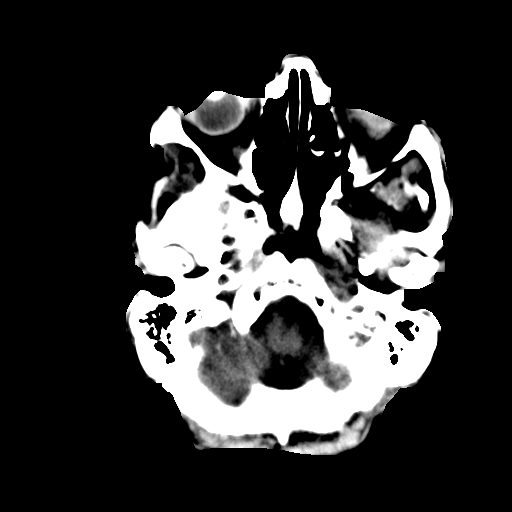
[im 7/30  brain]
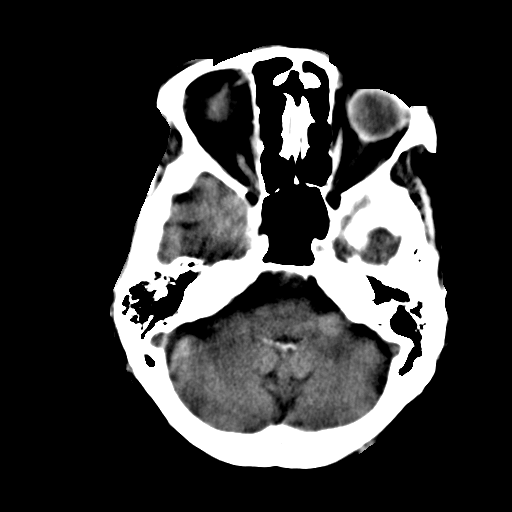
[im 10/30  brain]
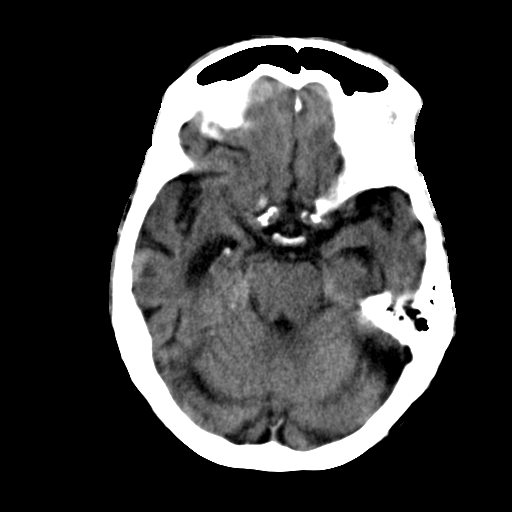
[im 13/30  brain]
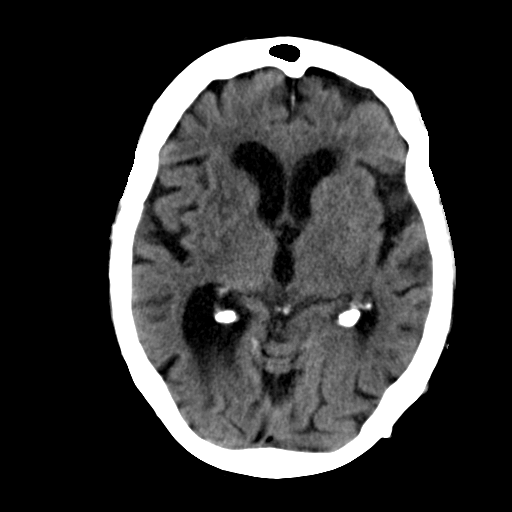
[im 17/30  brain]
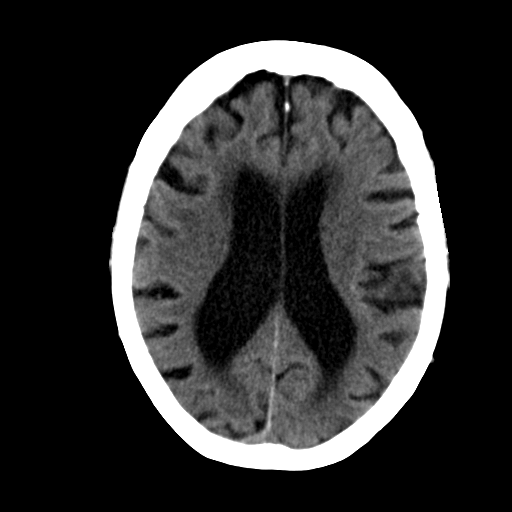
[im 20/30  brain]
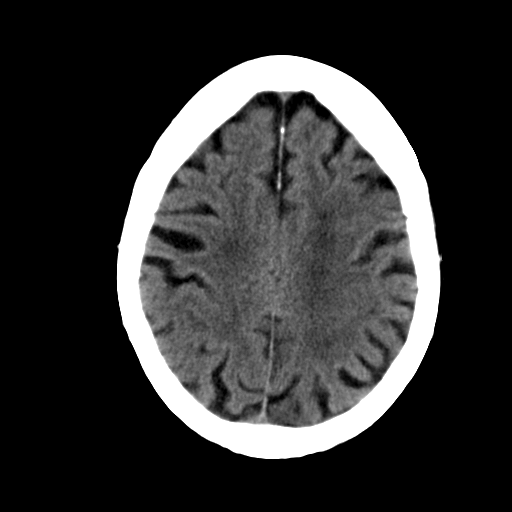
[im 23/30  brain]
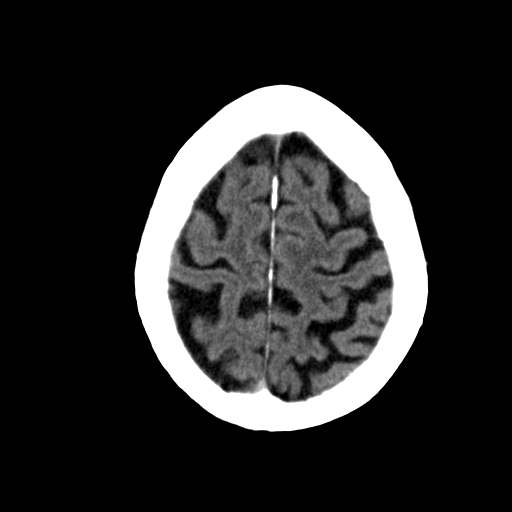
[im 26/30  brain]
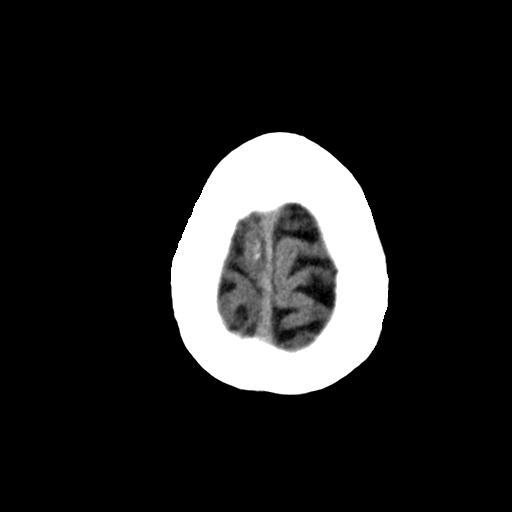

[Series 4: soft tissue recon · axial · 0.34mm/px · z∈[-467,-358]mm · 8 of 30 slices shown, 10 images]
[im 4/30  brain]
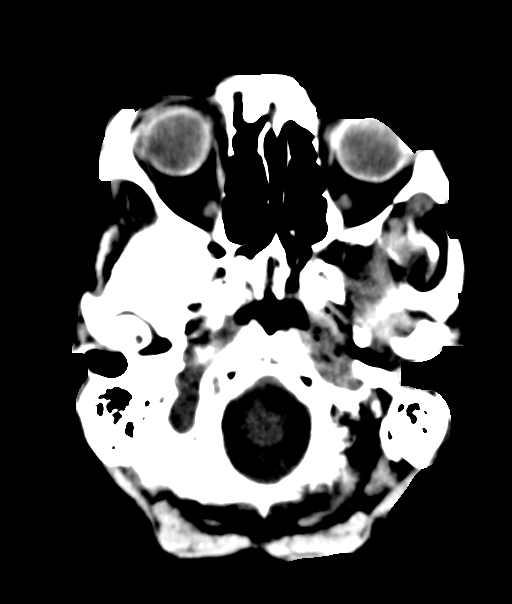
[im 4/30  bone]
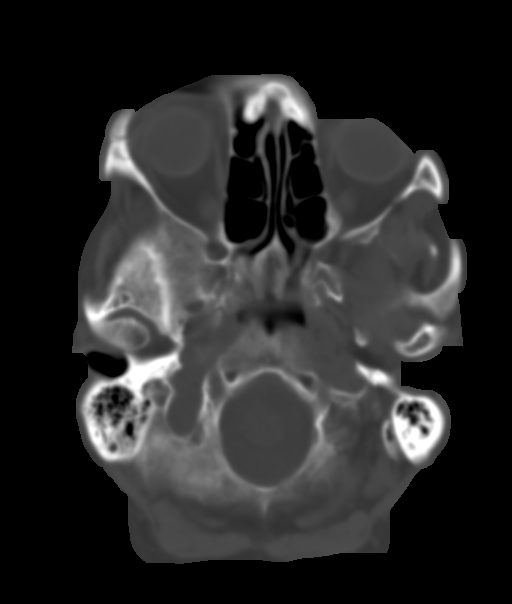
[im 7/30  brain]
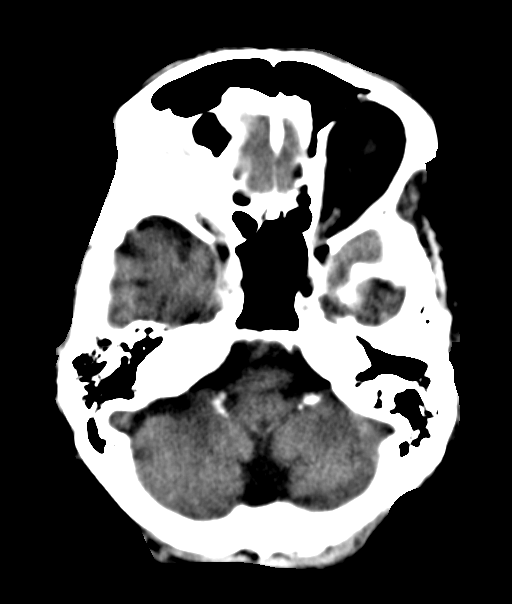
[im 10/30  brain]
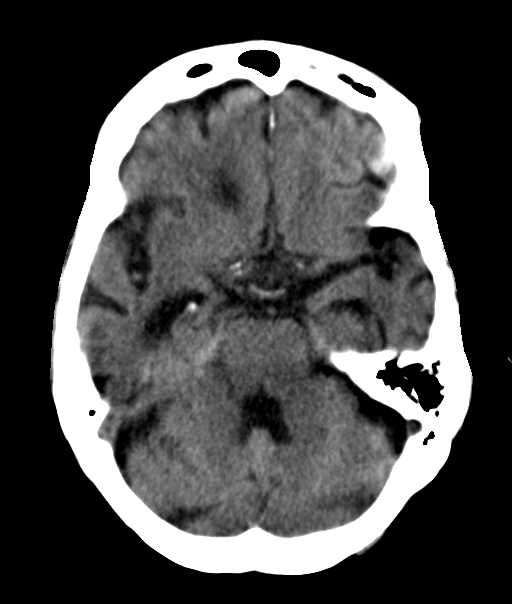
[im 13/30  brain]
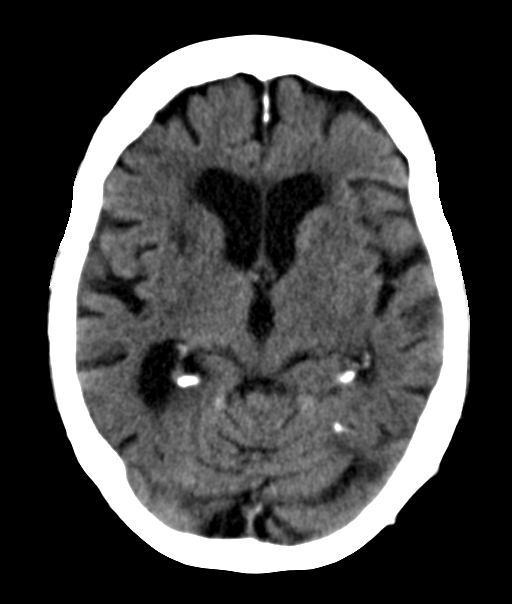
[im 17/30  brain]
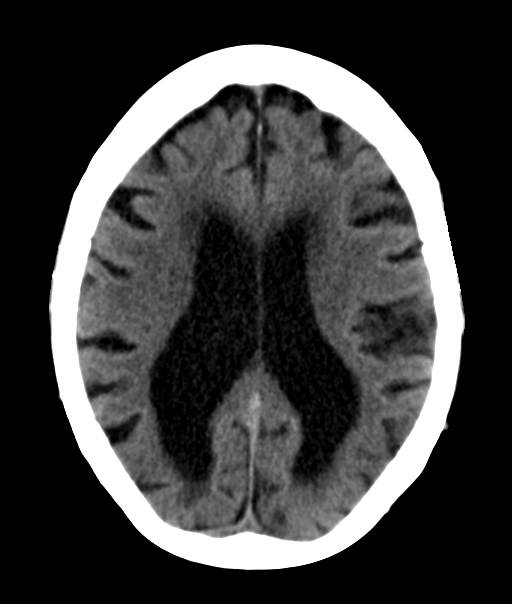
[im 17/30  bone]
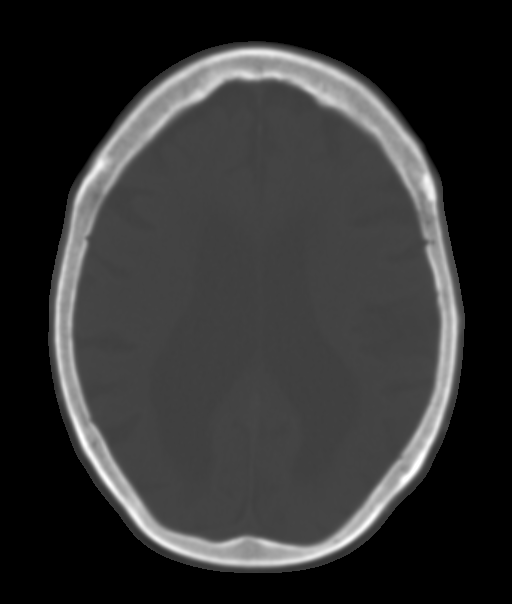
[im 20/30  brain]
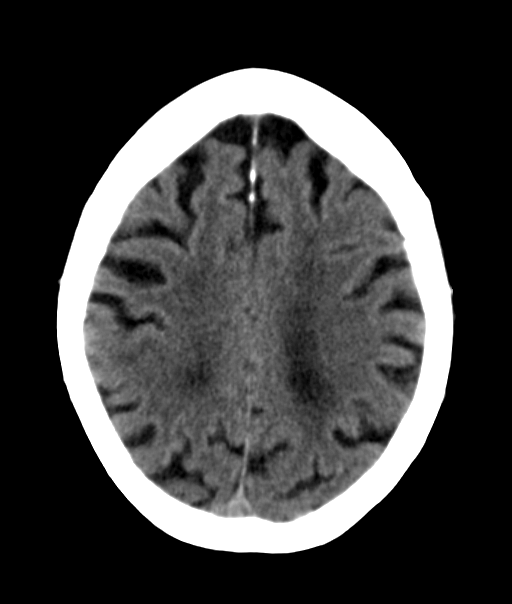
[im 23/30  brain]
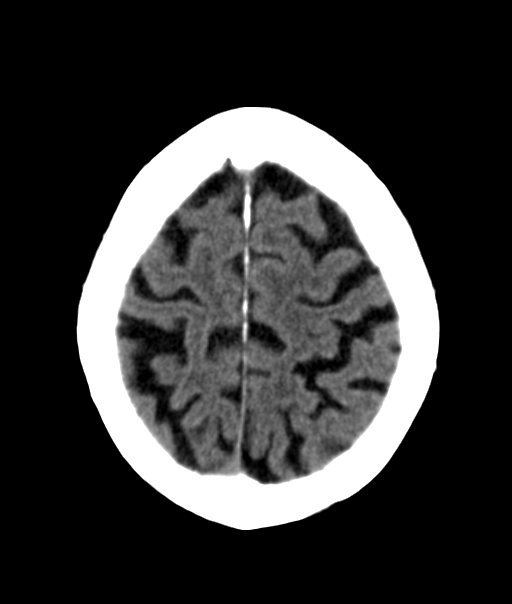
[im 26/30  brain]
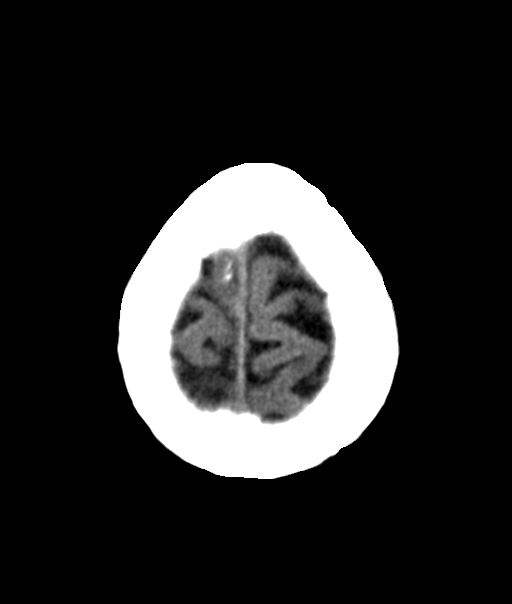

[16 of 30 positions shown; findings below may reference images not displayed]

FINDINGS: Moderate to severe ventriculomegaly, on the basis of global
parenchymal brain volume loss, stable from prior imaging. No
intraparenchymal hemorrhage, mass effect nor midline shift. Patchy
supratentorial white matter hypodensities are within normal range
for patient's age and though non-specific suggest sequelae of
chronic small vessel ischemic disease. No acute large vascular
territory infarcts. Old bilateral basal ganglia lacunar infarcts.

No abnormal extra-axial fluid collections. Basal cisterns are
patent. Moderate to severe calcific atherosclerosis of the carotid
siphons.

No skull fracture. Patient is osteopenic. The included ocular globes
and orbital contents are non-suspicious. Status post LEFT ocular
lens implant. Mild sphenoid mucosal thickening without paranasal
sinus air-fluid levels. Imaged mastoid air cells are well aerated.
IMPRESSION: No acute intracranial process.

Stable chronic changes including moderate to severe global brain
atrophy and old bilateral basal ganglia lacunar infarcts.

## 2016-03-08 ENCOUNTER — Emergency Department: Payer: Medicare Other

## 2016-03-08 ENCOUNTER — Encounter: Payer: Self-pay | Admitting: Emergency Medicine

## 2016-03-08 ENCOUNTER — Emergency Department
Admission: EM | Admit: 2016-03-08 | Discharge: 2016-03-08 | Disposition: A | Discharge status: Home / Self Care | Payer: Medicare Other | Attending: Internal Medicine | Admitting: Internal Medicine

## 2016-03-08 DIAGNOSIS — I10 Essential (primary) hypertension: Secondary | ICD-10-CM | POA: Insufficient documentation

## 2016-03-08 DIAGNOSIS — Y999 Unspecified external cause status: Secondary | ICD-10-CM | POA: Diagnosis not present

## 2016-03-08 DIAGNOSIS — Y92009 Unspecified place in unspecified non-institutional (private) residence as the place of occurrence of the external cause: Secondary | ICD-10-CM | POA: Diagnosis not present

## 2016-03-08 DIAGNOSIS — E860 Dehydration: Secondary | ICD-10-CM | POA: Diagnosis not present

## 2016-03-08 DIAGNOSIS — I2510 Atherosclerotic heart disease of native coronary artery without angina pectoris: Secondary | ICD-10-CM | POA: Insufficient documentation

## 2016-03-08 DIAGNOSIS — Z79899 Other long term (current) drug therapy: Secondary | ICD-10-CM | POA: Insufficient documentation

## 2016-03-08 DIAGNOSIS — Y939 Activity, unspecified: Secondary | ICD-10-CM | POA: Insufficient documentation

## 2016-03-08 DIAGNOSIS — R42 Dizziness and giddiness: Secondary | ICD-10-CM

## 2016-03-08 DIAGNOSIS — W19XXXA Unspecified fall, initial encounter: Secondary | ICD-10-CM | POA: Insufficient documentation

## 2016-03-08 DIAGNOSIS — J449 Chronic obstructive pulmonary disease, unspecified: Secondary | ICD-10-CM | POA: Diagnosis not present

## 2016-03-08 DIAGNOSIS — Z7982 Long term (current) use of aspirin: Secondary | ICD-10-CM | POA: Insufficient documentation

## 2016-03-08 LAB — CBC WITH DIFFERENTIAL/PLATELET
Basophils Absolute: 0 K/uL (ref 0–0.1)
Basophils Relative: 1 %
Eosinophils Absolute: 0.2 K/uL (ref 0–0.7)
Eosinophils Relative: 3 %
HCT: 34.3 % — ABNORMAL LOW (ref 35.0–47.0)
Hemoglobin: 11.9 g/dL — ABNORMAL LOW (ref 12.0–16.0)
Lymphocytes Relative: 14 %
Lymphs Abs: 0.9 K/uL — ABNORMAL LOW (ref 1.0–3.6)
MCH: 32.4 pg (ref 26.0–34.0)
MCHC: 34.6 g/dL (ref 32.0–36.0)
MCV: 93.7 fL (ref 80.0–100.0)
Monocytes Absolute: 0.9 K/uL (ref 0.2–0.9)
Monocytes Relative: 13 %
Neutro Abs: 4.7 K/uL (ref 1.4–6.5)
Neutrophils Relative %: 69 %
Platelets: 191 K/uL (ref 150–440)
RBC: 3.66 MIL/uL — ABNORMAL LOW (ref 3.80–5.20)
RDW: 15.3 % — ABNORMAL HIGH (ref 11.5–14.5)
WBC: 6.7 K/uL (ref 3.6–11.0)

## 2016-03-08 LAB — URINALYSIS COMPLETE WITH MICROSCOPIC (ARMC ONLY)
BILIRUBIN URINE: NEGATIVE
GLUCOSE, UA: NEGATIVE mg/dL
HGB URINE DIPSTICK: NEGATIVE
KETONES UR: NEGATIVE mg/dL
LEUKOCYTES UA: NEGATIVE
NITRITE: NEGATIVE
Protein, ur: NEGATIVE mg/dL
RBC / HPF: NONE SEEN RBC/hpf (ref 0–5)
SPECIFIC GRAVITY, URINE: 1.008 (ref 1.005–1.030)
Squamous Epithelial / LPF: NONE SEEN
pH: 7 (ref 5.0–8.0)

## 2016-03-08 LAB — COMPREHENSIVE METABOLIC PANEL WITH GFR
ALT: 11 U/L — ABNORMAL LOW (ref 14–54)
AST: 20 U/L (ref 15–41)
Albumin: 3.2 g/dL — ABNORMAL LOW (ref 3.5–5.0)
Alkaline Phosphatase: 93 U/L (ref 38–126)
Anion gap: 5 (ref 5–15)
BUN: 13 mg/dL (ref 6–20)
CO2: 27 mmol/L (ref 22–32)
Calcium: 8.7 mg/dL — ABNORMAL LOW (ref 8.9–10.3)
Chloride: 102 mmol/L (ref 101–111)
Creatinine, Ser: 0.79 mg/dL (ref 0.44–1.00)
GFR calc Af Amer: >60 mL/min
GFR calc non Af Amer: >60 mL/min
Glucose, Bld: 77 mg/dL (ref 65–99)
Potassium: 3.7 mmol/L (ref 3.5–5.1)
Sodium: 134 mmol/L — ABNORMAL LOW (ref 135–145)
Total Bilirubin: 0.6 mg/dL (ref 0.3–1.2)
Total Protein: 5.9 g/dL — ABNORMAL LOW (ref 6.5–8.1)

## 2016-03-08 LAB — TYPE AND SCREEN
ABO/RH(D): A POS
Antibody Screen: NEGATIVE

## 2016-03-08 LAB — TROPONIN I: Troponin I: <0.03 ng/mL

## 2016-03-08 MED ORDER — SODIUM CHLORIDE 0.9 % IV BOLUS (SEPSIS)
1000.0000 mL | Freq: Once | INTRAVENOUS | Status: AC
  Administered 2016-03-08: 1000 mL via INTRAVENOUS

## 2016-03-08 NOTE — ED Notes (Signed)
Attempted to call report to [facility]. Caregiver is unable to take report. Will give report to EMS and they can give to [facility].

## 2016-03-08 NOTE — ED Notes (Signed)
Told by secretary that pt was found walking in the hallway half-naked with blood on her leg. Walked into room and noticed that patient took her IV out and blood was on the floor and in bathroom. Pt stated " I had to pee". Pt found sitting in a chair wondering why she was here. Pt alert not oriented to place, person, or time. Pt moved to a hallway due to safety reasons. Fall risk bracelet applied and fall alarm placed on pt

## 2016-03-08 NOTE — ED Provider Notes (Signed)
[facility] Emergency Department Provider Note     L5 caveat: Review of systems and history is limited by dementia   Time seen: ----------------------------------------- 9:59 AM on 03/08/2016 -----------------------------------------    I have reviewed the triage vital signs and the nursing notes.   HISTORY  Chief Complaint Fall and Dizziness    HPI Karen Bradshaw is a 82 y.o. female who presents to ER being brought by EMS for achiness and dizziness. Patient had a fall of uncertain etiology today. Patient still complaining of weakness, was noted to be hypotensive on arrival. According to EMS she recently was started on Cipro for UTI.   Past Medical History:  Diagnosis Date  . Atrial fibrillation (HCC)   . COPD (chronic obstructive pulmonary disease) (HCC)   . Coronary artery disease   . Dementia   . Depression   . Hypertension     There are no active problems to display for this patient.   No past surgical history on file.  Allergies Ace inhibitors; Sulfa antibiotics; and Erythromycin  Social History Social History  Substance Use Topics  . Smoking status: Never Smoker  . Smokeless tobacco: Not on file  . Alcohol use No    Review of Systems Review of systems is otherwise unknown other than weakness  ____________________________________________   PHYSICAL EXAM:  VITAL SIGNS: ED Triage Vitals  Enc Vitals Group     BP      Pulse      Resp      Temp      Temp src      SpO2      Weight      Height      Head Circumference      Peak Flow      Pain Score      Pain Loc      Pain Edu?      Excl. in GC?     Constitutional: Alert But disoriented, no distress Eyes: Conjunctivae are pale. Normal extraocular movements. ENT   Head: Normocephalic and atraumatic.   Nose: No congestion/rhinnorhea.   Mouth/Throat: Mucous membranes are moist.   Neck: No stridor. Cardiovascular: Normal rate, regular rhythm. No  murmurs, rubs, or gallops. Respiratory: Normal respiratory effort without tachypnea nor retractions. Breath sounds are clear and equal bilaterally. No wheezes/rales/rhonchi. Gastrointestinal: Soft and nontender. Normal bowel sounds Musculoskeletal: Nontender with normal range of motion in all extremities. No lower extremity tenderness nor edema. Neurologic:  Normal speech and language. No gross focal neurologic deficits are appreciated.  Skin:  Skin is warm, dry and intact. Pallor is noted Psychiatric: Mood and affect are normal. Speech and behavior are normal.  ____________________________________________  EKG: Interpreted by me. Sinus rhythm rate of 50 bpm, normal PR interval, normal QRS, normal QT interval. Normal axis.  ____________________________________________  ED COURSE:  Pertinent labs & imaging results that were available during my care of the patient were reviewed by me and considered in my medical decision making (see chart for details). Clinical Course  Patient presents to ER for weakness, possible underlying anemia. We will assess with basic labs and imaging.  Procedures ____________________________________________   LABS (pertinent positives/negatives)  Labs Reviewed  CBC WITH DIFFERENTIAL/PLATELET - Abnormal; Notable for the following:       Result Value   RBC 3.66 (*)    Hemoglobin 11.9 (*)    HCT 34.3 (*)    RDW 15.3 (*)    Lymphs Abs 0.9 (*)    All other  components within normal limits  COMPREHENSIVE METABOLIC PANEL - Abnormal; Notable for the following:    Sodium 134 (*)    Calcium 8.7 (*)    Total Protein 5.9 (*)    Albumin 3.2 (*)    ALT 11 (*)    All other components within normal limits  URINALYSIS COMPLETEWITH MICROSCOPIC ([facility] ONLY) - Abnormal; Notable for the following:    Color, Urine STRAW (*)    APPearance CLEAR (*)    Bacteria, UA RARE (*)    All other components within normal limits  TROPONIN I  TYPE AND SCREEN    RADIOLOGY  Chest  x-ray IMPRESSION: Areas of scarring and fibrosis. No edema or consolidation. Heart size within normal limits. There is aortic atherosclerosis. Bones osteoporotic. ____________________________________________  FINAL ASSESSMENT AND PLAN  Dizziness  Plan: Patient with labs and imaging as dictated above. Patient with remarkably normal lab work, is awake and appropriate with no complaints here. After a liter fluid symptoms improved considerably. Current blood pressure is 138 systolic. No signs of infection. She is stable for outpatient follow-up.   Emily FilbertWilliams, Jonathan E, MD   Note: This dictation was prepared with Dragon dictation. Any transcriptional errors that result from this process are unintentional    Emily FilbertJonathan E Williams, MD 03/08/16 [phone]

## 2016-03-08 NOTE — ED Triage Notes (Signed)
[facility]  Pt denies pain and has no obvious injuries.  However, she does endorse dizziness.  EMS reported BP of 80/40 at scene.  Pt alert and speaking in clear sentences, but slightly disoriented. Pt has chronic dementia.

## 2017-04-08 DEATH — deceased
